# Patient Record
Sex: Female | Born: 1985 | Race: White | Hispanic: No | Marital: Married | Smoking: Never smoker
Health system: Southern US, Community
[De-identification: ages and names within clinical notes are randomized; demographics above are authoritative.]

## PROBLEM LIST (undated history)

## (undated) ENCOUNTER — Inpatient Hospital Stay (HOSPITAL_COMMUNITY): Payer: Self-pay

## (undated) DIAGNOSIS — D582 Other hemoglobinopathies: Secondary | ICD-10-CM

## (undated) DIAGNOSIS — K219 Gastro-esophageal reflux disease without esophagitis: Secondary | ICD-10-CM

## (undated) DIAGNOSIS — Z8619 Personal history of other infectious and parasitic diseases: Secondary | ICD-10-CM

## (undated) DIAGNOSIS — D649 Anemia, unspecified: Secondary | ICD-10-CM

## (undated) DIAGNOSIS — N3010 Interstitial cystitis (chronic) without hematuria: Secondary | ICD-10-CM

## (undated) DIAGNOSIS — Z8719 Personal history of other diseases of the digestive system: Secondary | ICD-10-CM

## (undated) DIAGNOSIS — Z8489 Family history of other specified conditions: Secondary | ICD-10-CM

## (undated) DIAGNOSIS — R011 Cardiac murmur, unspecified: Secondary | ICD-10-CM

## (undated) DIAGNOSIS — J45909 Unspecified asthma, uncomplicated: Secondary | ICD-10-CM

## (undated) DIAGNOSIS — K649 Unspecified hemorrhoids: Secondary | ICD-10-CM

## (undated) DIAGNOSIS — F419 Anxiety disorder, unspecified: Secondary | ICD-10-CM

## (undated) DIAGNOSIS — K589 Irritable bowel syndrome without diarrhea: Secondary | ICD-10-CM

## (undated) DIAGNOSIS — I499 Cardiac arrhythmia, unspecified: Secondary | ICD-10-CM

## (undated) DIAGNOSIS — Z8711 Personal history of peptic ulcer disease: Secondary | ICD-10-CM

## (undated) DIAGNOSIS — R51 Headache: Secondary | ICD-10-CM

## (undated) DIAGNOSIS — R000 Tachycardia, unspecified: Secondary | ICD-10-CM

## (undated) HISTORY — DX: Personal history of other infectious and parasitic diseases: Z86.19

## (undated) HISTORY — DX: Anemia, unspecified: D64.9

## (undated) HISTORY — DX: Gastro-esophageal reflux disease without esophagitis: K21.9

## (undated) HISTORY — DX: Unspecified asthma, uncomplicated: J45.909

## (undated) HISTORY — DX: Other hemoglobinopathies: D58.2

## (undated) HISTORY — DX: Personal history of other diseases of the digestive system: Z87.19

## (undated) HISTORY — DX: Headache: R51

## (undated) HISTORY — DX: Cardiac arrhythmia, unspecified: I49.9

## (undated) HISTORY — DX: Interstitial cystitis (chronic) without hematuria: N30.10

## (undated) HISTORY — DX: Personal history of peptic ulcer disease: Z87.11

## (undated) HISTORY — DX: Irritable bowel syndrome, unspecified: K58.9

## (undated) HISTORY — PX: COLONOSCOPY: SHX174

## (undated) HISTORY — DX: Unspecified hemorrhoids: K64.9

---
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
## 2006-02-27 ENCOUNTER — Ambulatory Visit: Payer: Self-pay | Admitting: Internal Medicine

## 2007-10-18 HISTORY — PX: LAPAROSCOPY: SHX197

## 2008-04-10 ENCOUNTER — Ambulatory Visit: Payer: Self-pay | Admitting: Family Medicine

## 2010-07-01 ENCOUNTER — Ambulatory Visit: Payer: Self-pay | Admitting: Urology

## 2010-07-02 ENCOUNTER — Emergency Department: Payer: Self-pay | Admitting: Emergency Medicine

## 2010-07-07 ENCOUNTER — Ambulatory Visit: Payer: Self-pay | Admitting: Unknown Physician Specialty

## 2010-07-19 ENCOUNTER — Other Ambulatory Visit: Payer: Self-pay

## 2010-10-27 ENCOUNTER — Observation Stay (HOSPITAL_COMMUNITY)
Admission: AD | Admit: 2010-10-27 | Discharge: 2010-10-28 | Payer: Self-pay | Source: Home / Self Care | Attending: Obstetrics & Gynecology | Admitting: Obstetrics & Gynecology

## 2010-11-01 LAB — CBC
HCT: 33.8 % — ABNORMAL LOW (ref 36.0–46.0)
Hemoglobin: 11.8 g/dL — ABNORMAL LOW (ref 12.0–15.0)
MCH: 29.8 pg (ref 26.0–34.0)
MCHC: 34.9 g/dL (ref 30.0–36.0)
MCV: 85.4 fL (ref 78.0–100.0)
Platelets: 196 10*3/uL (ref 150–400)
RBC: 3.96 MIL/uL (ref 3.87–5.11)
RDW: 12.6 % (ref 11.5–15.5)
WBC: 6.3 10*3/uL (ref 4.0–10.5)

## 2010-11-01 LAB — COMPREHENSIVE METABOLIC PANEL
ALT: 40 U/L — ABNORMAL HIGH (ref 0–35)
AST: 28 U/L (ref 0–37)
Albumin: 3.4 g/dL — ABNORMAL LOW (ref 3.5–5.2)
Alkaline Phosphatase: 48 U/L (ref 39–117)
BUN: 9 mg/dL (ref 6–23)
CO2: 23 mEq/L (ref 19–32)
Calcium: 8.9 mg/dL (ref 8.4–10.5)
Chloride: 107 mEq/L (ref 96–112)
Creatinine, Ser: 0.53 mg/dL (ref 0.4–1.2)
GFR calc Af Amer: >60 mL/min (ref >60)
GFR calc non Af Amer: >60 mL/min (ref >60)
Glucose, Bld: 72 mg/dL (ref 70–99)
Potassium: 3.9 mEq/L (ref 3.5–5.1)
Sodium: 136 mEq/L (ref 135–145)
Total Bilirubin: 0.5 mg/dL (ref 0.3–1.2)
Total Protein: 6 g/dL (ref 6.0–8.3)

## 2010-11-01 LAB — URINALYSIS, ROUTINE W REFLEX MICROSCOPIC
Bilirubin Urine: NEGATIVE
Hgb urine dipstick: NEGATIVE
Ketones, ur: 15 mg/dL — AB
Nitrite: NEGATIVE
Protein, ur: NEGATIVE mg/dL
Specific Gravity, Urine: >1.03 — ABNORMAL HIGH (ref 1.005–1.030)
Urine Glucose, Fasting: NEGATIVE mg/dL
Urobilinogen, UA: 0.2 mg/dL (ref 0.0–1.0)
pH: 6 (ref 5.0–8.0)

## 2010-11-01 LAB — DIFFERENTIAL
Basophils Absolute: 0 10*3/uL (ref 0.0–0.1)
Basophils Relative: 0 % (ref 0–1)
Eosinophils Absolute: 0.1 10*3/uL (ref 0.0–0.7)
Eosinophils Relative: 1 % (ref 0–5)
Lymphocytes Relative: 35 % (ref 12–46)
Lymphs Abs: 2.2 10*3/uL (ref 0.7–4.0)
Monocytes Absolute: 0.5 10*3/uL (ref 0.1–1.0)
Monocytes Relative: 8 % (ref 3–12)
Neutro Abs: 3.5 10*3/uL (ref 1.7–7.7)
Neutrophils Relative %: 56 % (ref 43–77)

## 2010-11-01 LAB — LIPASE, BLOOD: Lipase: 18 U/L (ref 11–59)

## 2010-11-01 LAB — AMYLASE: Amylase: 49 U/L (ref 0–105)

## 2011-04-10 ENCOUNTER — Inpatient Hospital Stay (HOSPITAL_COMMUNITY)
Admission: AD | Admit: 2011-04-10 | Discharge: 2011-04-13 | DRG: 373 | Disposition: A | Discharge status: Home / Self Care | Payer: BC Managed Care – PPO | Source: Ambulatory Visit | Attending: Obstetrics and Gynecology | Admitting: Obstetrics and Gynecology

## 2011-04-10 DIAGNOSIS — O9903 Anemia complicating the puerperium: Secondary | ICD-10-CM | POA: Diagnosis not present

## 2011-04-10 DIAGNOSIS — D62 Acute posthemorrhagic anemia: Secondary | ICD-10-CM | POA: Diagnosis not present

## 2011-04-10 DIAGNOSIS — K259 Gastric ulcer, unspecified as acute or chronic, without hemorrhage or perforation: Secondary | ICD-10-CM | POA: Diagnosis present

## 2011-04-10 DIAGNOSIS — O99892 Other specified diseases and conditions complicating childbirth: Secondary | ICD-10-CM | POA: Diagnosis present

## 2011-04-10 DIAGNOSIS — I498 Other specified cardiac arrhythmias: Secondary | ICD-10-CM | POA: Diagnosis present

## 2011-04-10 LAB — CBC
HCT: 32.1 % — ABNORMAL LOW (ref 36.0–46.0)
Hemoglobin: 10.6 g/dL — ABNORMAL LOW (ref 12.0–15.0)
MCH: 27.9 pg (ref 26.0–34.0)
MCHC: 33 g/dL (ref 30.0–36.0)
MCV: 84.5 fL (ref 78.0–100.0)
Platelets: 224 10*3/uL (ref 150–400)
RBC: 3.8 MIL/uL — ABNORMAL LOW (ref 3.87–5.11)
RDW: 14.5 % (ref 11.5–15.5)
WBC: 8.8 10*3/uL (ref 4.0–10.5)

## 2011-04-11 LAB — RPR: RPR Ser Ql: NONREACTIVE

## 2011-04-13 LAB — CBC
HCT: 24.8 % — ABNORMAL LOW (ref 36.0–46.0)
Hemoglobin: 8 g/dL — ABNORMAL LOW (ref 12.0–15.0)
MCH: 27.8 pg (ref 26.0–34.0)
MCHC: 32.3 g/dL (ref 30.0–36.0)
MCV: 86.1 fL (ref 78.0–100.0)
Platelets: 190 10*3/uL (ref 150–400)
RBC: 2.88 MIL/uL — ABNORMAL LOW (ref 3.87–5.11)
RDW: 14.8 % (ref 11.5–15.5)
WBC: 12.6 10*3/uL — ABNORMAL HIGH (ref 4.0–10.5)

## 2011-04-22 NOTE — H&P (Signed)
  NAMEIDA, MILBRATH NO.:  [id number]  MEDICAL RECORD NO.:  [id number]  LOCATION:  9173                          FACILITY:  [facility]  PHYSICIAN:  Lenoard Aden, M.D.DATE OF BIRTH:  [birth year]  DATE OF ADMISSION:  04/10/2011 DATE OF DISCHARGE:                             HISTORY & PHYSICAL   CHIEF COMPLAINT:  History of cardiac arrhythmia, on beta-blocker, at 39 weeks for cervical ripening and induction.  HISTORY OF PRESENT ILLNESS:  She is a 25 year old female G1, P0 at 39 weeks, on chronic metoprolol use for SVT, symptomatic with intermittent chest pain and shortness of breath requiring Cardiology followup, now for controlled attempted induction at 39 weeks.  MEDICATIONS:  Include prenatal vitamins, Vicodin as needed for migraines, and atenolol.  She has allergies occasionally to Lasix.  She is a nonsmoker, nondrinker.  She denies domestic or physical violence.  MEDICAL HISTORY:  Peptic ulcer disease, headaches, interstitial cystitis and  SVT.  SURGICAL HISTORY:  Otherwise, noncontributory.  PRENATAL COURSE:  Otherwise, uncomplicated.  FAMILY HISTORY:  Kidney disease, thyroid disease, heart disease.  PHYSICAL EXAMINATION:  GENERAL:  She is a well-developed, well-nourished white female, in no acute distress. HEENT:  Normal. NECK:  Supple.  Full range of motion. LUNGS:  Clear. HEART:  Regular rhythm. ABDOMEN:  Soft, gravid, and nontender. PELVIC: Cervix is 2-3 cm, 60%, Vertex, -1. EXTREMITIES:  There are no cords. NEUROLOGIC:  Nonfocal. SKIN:  Intact.  IMPRESSION: 1. A 39-week intrauterine pregnancy. 2. History of supraventricular tachycardia, intermittent and     symptomatic. 3. Gastric ulcer disease.  PLAN:  Proceed with cervical ripening and induction, anticipate cautious attempts at vaginal delivery.     Lenoard Aden, M.D.     RJT/MEDQ  D:  04/11/2011  T:  04/11/2011  Job:  [id number]  Electronically Signed by Olivia Mackie M.D. on 04/22/2011 07:36:28 AM

## 2012-08-08 LAB — OB RESULTS CONSOLE ABO/RH: RH Type: POSITIVE

## 2012-08-08 LAB — OB RESULTS CONSOLE GC/CHLAMYDIA
Chlamydia: NEGATIVE
Gonorrhea: NEGATIVE

## 2012-08-08 LAB — OB RESULTS CONSOLE HEPATITIS B SURFACE ANTIGEN: Hepatitis B Surface Ag: NEGATIVE

## 2012-08-08 LAB — OB RESULTS CONSOLE RUBELLA ANTIBODY, IGM: Rubella: IMMUNE

## 2012-08-08 LAB — OB RESULTS CONSOLE RPR: RPR: NONREACTIVE

## 2012-08-08 LAB — OB RESULTS CONSOLE HIV ANTIBODY (ROUTINE TESTING): HIV: NONREACTIVE

## 2012-10-17 NOTE — L&D Delivery Note (Signed)
Delivery Note At 11:00 AM a viable and healthy female was delivered via Vaginal, Spontaneous Delivery (Presentation:ROA ).  APGAR: 9, 9; weight pending.   Placenta status: Intact, Spontaneous.  Cord: 3 vessels with the following complications: None.  Cord pH: na  Anesthesia: Epidural  Episiotomy: None Lacerations: second degree Suture Repair: 2.0 vicryl rapide Est. Blood Loss (mL): 200  Mom to postpartum.  Baby to nursery-stable.  Samora Jernberg J 03/04/2013, 11:27 AM

## 2012-11-05 ENCOUNTER — Inpatient Hospital Stay (HOSPITAL_COMMUNITY)
Admission: AD | Admit: 2012-11-05 | Discharge: 2012-11-05 | Disposition: A | Discharge status: Home / Self Care | Payer: BC Managed Care – PPO | Source: Ambulatory Visit | Attending: Obstetrics & Gynecology | Admitting: Obstetrics & Gynecology

## 2012-11-05 ENCOUNTER — Encounter (HOSPITAL_COMMUNITY): Payer: Self-pay | Admitting: *Deleted

## 2012-11-05 DIAGNOSIS — O210 Mild hyperemesis gravidarum: Secondary | ICD-10-CM | POA: Insufficient documentation

## 2012-11-05 DIAGNOSIS — R109 Unspecified abdominal pain: Secondary | ICD-10-CM | POA: Insufficient documentation

## 2012-11-05 LAB — COMPREHENSIVE METABOLIC PANEL
ALT: 15 U/L (ref 0–35)
AST: 23 U/L (ref 0–37)
Albumin: 3 g/dL — ABNORMAL LOW (ref 3.5–5.2)
Alkaline Phosphatase: 60 U/L (ref 39–117)
BUN: 10 mg/dL (ref 6–23)
CO2: 26 mEq/L (ref 19–32)
Calcium: 8.4 mg/dL (ref 8.4–10.5)
Chloride: 100 mEq/L (ref 96–112)
Creatinine, Ser: 0.68 mg/dL (ref 0.50–1.10)
GFR calc Af Amer: >90 mL/min (ref >90)
GFR calc non Af Amer: >90 mL/min (ref >90)
Glucose, Bld: 86 mg/dL (ref 70–99)
Potassium: 4.2 mEq/L (ref 3.5–5.1)
Sodium: 135 mEq/L (ref 135–145)
Total Bilirubin: 0.4 mg/dL (ref 0.3–1.2)
Total Protein: 6.5 g/dL (ref 6.0–8.3)

## 2012-11-05 MED ORDER — ACETAMINOPHEN 325 MG PO TABS
650.0000 mg | ORAL_TABLET | Freq: Once | ORAL | Status: AC
Start: 2012-11-05 — End: 2012-11-05
  Administered 2012-11-05: 650 mg via ORAL
  Filled 2012-11-05: qty 2

## 2012-11-05 MED ORDER — PROMETHAZINE HCL 25 MG/ML IJ SOLN
12.5000 mg | Freq: Once | INTRAVENOUS | Status: AC
  Administered 2012-11-05: 12.5 mg via INTRAVENOUS
  Filled 2012-11-05: qty 0.5

## 2012-11-05 NOTE — Progress Notes (Signed)
Patient ID: Haley Wong, female   DOB: December 29, 1985, 27 y.o.   MRN: [id number] 36 hours of nausea and vomiting with ketonuria. VSS - afebrile PE dictated. Note dictated.

## 2012-11-05 NOTE — MAU Note (Signed)
Vomiting for over 24hours.  Rt side pain, worse when throws up.  Went and saw Dr Billy Coast, sent in for fluids.

## 2012-11-05 NOTE — H&P (Signed)
NAMESENIYA, STOFFERS NO.:  [id number]  MEDICAL RECORD NO.:  [id number]  LOCATION:  9157                          [facility]ILITY:  [facility]  PHYSICIAN:  Lenoard Aden, M.D.DATE OF BIRTH:  Dec 03, 1985  DATE OF ADMISSION:  11/05/2012 DATE OF DISCHARGE:                             HISTORY & PHYSICAL   CHIEF COMPLAINT:  Nausea and vomiting times 24-36 hours and lower abdominal cramping.  HISTORY OF PRESENT ILLNESS:  She is a 27 year old white female, G2, P1, at 38 and 2/7th weeks gestation with uncomplicated pregnancy to date, who presents with acute onset of nausea and vomiting over the last 24-36 hours, unable to keep p.o. fluids down.  She was seen in the office today for intravenous fluids. Normal CBC and normal CMP.  The patient has been afebrile.  PAST MEDICAL HISTORY:  Remarkable.  ALLERGIES:  She has allergic to latex and pineapple.  MEDICATIONS:  Zofran, Nexium, ProAir HFA inhaler p.r.n., prenatal vitamins, and metoprolol for palpitations.  SOCIAL HISTORY:  Noncontributory.  FAMILY HISTORY:  Family history of heart disease, kidney disease, diabetes, and chronic hypertension.  She has a previous history of vaginal delivery at term.  PAST SURGICAL HISTORY:  Remarkable for laparoscopy and upper GI with endoscopy.  PHYSICAL EXAMINATION:  GENERAL:  She is a well-developed, well- nourished, white female, in no acute distress.  HEENT:  Normal. NECK:  Supple.  Full range of motion. LUNGS:  Clear. HEART:  Regular rate and rhythm. ABDOMEN:  Soft, gravid, nontender.  No rebound, no guarding.  Normal bowel sounds noted.  Fetal heart tones appreciated.  Fundal height is appropriate.  No CVA tenderness. EXTREMITIES:  There are no cords. NEUROLOGIC:  Nonfocal. SKIN:  Intact. PELVIC:  Deferred.  LABORATORY DATA:  CMP and CBC is within normal limits.  White count of 5.4, otherwise normal.  Profile was noted.  Fetal heart tones reassuring.  IMPRESSION:  A 22 week  intrauterine pregnancy with nausea, vomiting, and probable viral gastroenteritis.  PLAN:  IV fluids, antiemetics, possible GI cocktail.  Anticipate discharge home after IV fluids.     Lenoard Aden, M.D.     RJT/MEDQ  D:  11/05/2012  T:  11/05/2012  Job:  [id number]

## 2013-02-07 LAB — OB RESULTS CONSOLE GBS: GBS: NEGATIVE

## 2013-02-22 ENCOUNTER — Other Ambulatory Visit: Payer: Self-pay | Admitting: Obstetrics and Gynecology

## 2013-02-25 ENCOUNTER — Encounter (HOSPITAL_COMMUNITY): Payer: Self-pay | Admitting: *Deleted

## 2013-02-25 ENCOUNTER — Other Ambulatory Visit: Payer: Self-pay | Admitting: Obstetrics and Gynecology

## 2013-02-25 ENCOUNTER — Telehealth (HOSPITAL_COMMUNITY): Payer: Self-pay | Admitting: *Deleted

## 2013-02-25 NOTE — Telephone Encounter (Signed)
Preadmission screen  

## 2013-03-03 ENCOUNTER — Inpatient Hospital Stay (HOSPITAL_COMMUNITY)
Admission: RE | Admit: 2013-03-03 | Discharge: 2013-03-06 | DRG: 372 | Disposition: A | Discharge status: Home / Self Care | Payer: BC Managed Care – PPO | Source: Ambulatory Visit | Attending: Obstetrics and Gynecology | Admitting: Obstetrics and Gynecology

## 2013-03-03 ENCOUNTER — Inpatient Hospital Stay (HOSPITAL_COMMUNITY): Admission: AD | Admit: 2013-03-03 | Payer: Self-pay | Source: Ambulatory Visit | Admitting: Obstetrics and Gynecology

## 2013-03-03 ENCOUNTER — Encounter (HOSPITAL_COMMUNITY): Payer: Self-pay

## 2013-03-03 DIAGNOSIS — Z91040 Latex allergy status: Secondary | ICD-10-CM

## 2013-03-03 DIAGNOSIS — I2510 Atherosclerotic heart disease of native coronary artery without angina pectoris: Secondary | ICD-10-CM | POA: Diagnosis present

## 2013-03-03 DIAGNOSIS — O4100X0 Oligohydramnios, unspecified trimester, not applicable or unspecified: Principal | ICD-10-CM | POA: Diagnosis present

## 2013-03-03 DIAGNOSIS — I498 Other specified cardiac arrhythmias: Secondary | ICD-10-CM | POA: Diagnosis present

## 2013-03-03 HISTORY — DX: Anxiety disorder, unspecified: F41.9

## 2013-03-03 HISTORY — DX: Tachycardia, unspecified: R00.0

## 2013-03-03 LAB — CBC
Platelets: 222 10*3/uL (ref 150–400)
RDW: 14.3 % (ref 11.5–15.5)
WBC: 8.2 10*3/uL (ref 4.0–10.5)

## 2013-03-03 MED ORDER — OXYCODONE-ACETAMINOPHEN 5-325 MG PO TABS: 1.0000 | ORAL_TABLET | ORAL | Status: DC | PRN

## 2013-03-03 MED ORDER — LIDOCAINE HCL (PF) 1 % IJ SOLN
30.0000 mL | INTRAMUSCULAR | Status: DC | PRN
  Filled 2013-03-03 (×2): qty 30

## 2013-03-03 MED ORDER — ACETAMINOPHEN 325 MG PO TABS: 650.0000 mg | ORAL_TABLET | ORAL | Status: DC | PRN

## 2013-03-03 MED ORDER — LACTATED RINGERS IV SOLN
INTRAVENOUS | Status: DC
  Administered 2013-03-03 – 2013-03-04 (×3): via INTRAVENOUS

## 2013-03-03 MED ORDER — LACTATED RINGERS IV SOLN: 500.0000 mL | INTRAVENOUS | Status: DC | PRN

## 2013-03-03 MED ORDER — OXYTOCIN BOLUS FROM INFUSION: 500.0000 mL | INTRAVENOUS | Status: DC

## 2013-03-03 MED ORDER — CITRIC ACID-SODIUM CITRATE 334-500 MG/5ML PO SOLN: 30.0000 mL | ORAL | Status: DC | PRN

## 2013-03-03 MED ORDER — ONDANSETRON HCL 4 MG/2ML IJ SOLN
4.0000 mg | Freq: Four times a day (QID) | INTRAMUSCULAR | Status: DC | PRN
  Administered 2013-03-04: 4 mg via INTRAVENOUS
  Filled 2013-03-03: qty 2

## 2013-03-03 MED ORDER — METOPROLOL SUCCINATE ER 25 MG PO TB24
25.0000 mg | ORAL_TABLET | Freq: Every day | ORAL | Status: DC
  Administered 2013-03-03: 25 mg via ORAL
  Filled 2013-03-03 (×2): qty 1

## 2013-03-03 MED ORDER — HOME MED STORE IN PYXIS: 2.0000 | Freq: Four times a day (QID) | Status: DC | PRN

## 2013-03-03 MED ORDER — FLEET ENEMA 7-19 GM/118ML RE ENEM: 1.0000 | ENEMA | RECTAL | Status: DC | PRN

## 2013-03-03 MED ORDER — TERBUTALINE SULFATE 1 MG/ML IJ SOLN: 0.2500 mg | Freq: Once | INTRAMUSCULAR | Status: AC | PRN

## 2013-03-03 MED ORDER — ZOLPIDEM TARTRATE 5 MG PO TABS
5.0000 mg | ORAL_TABLET | Freq: Every evening | ORAL | Status: DC | PRN
  Administered 2013-03-03: 5 mg via ORAL
  Filled 2013-03-03: qty 1

## 2013-03-03 MED ORDER — MISOPROSTOL 25 MCG QUARTER TABLET
25.0000 ug | ORAL_TABLET | ORAL | Status: DC | PRN
  Administered 2013-03-03 – 2013-03-04 (×2): 25 ug via VAGINAL
  Filled 2013-03-03: qty 1
  Filled 2013-03-03 (×2): qty 0.25

## 2013-03-03 MED ORDER — IBUPROFEN 600 MG PO TABS: 600.0000 mg | ORAL_TABLET | Freq: Four times a day (QID) | ORAL | Status: DC | PRN

## 2013-03-03 MED ORDER — OXYTOCIN 40 UNITS IN LACTATED RINGERS INFUSION - SIMPLE MED
1.0000 m[IU]/min | INTRAVENOUS | Status: DC
  Administered 2013-03-04: 2 m[IU]/min via INTRAVENOUS
  Filled 2013-03-03: qty 1000

## 2013-03-03 MED ORDER — ALBUTEROL SULFATE HFA 108 (90 BASE) MCG/ACT IN AERS
2.0000 | INHALATION_SPRAY | Freq: Four times a day (QID) | RESPIRATORY_TRACT | Status: DC | PRN
  Administered 2013-03-03: 2 via RESPIRATORY_TRACT

## 2013-03-03 MED ORDER — OXYTOCIN 40 UNITS IN LACTATED RINGERS INFUSION - SIMPLE MED: 62.5000 mL/h | INTRAVENOUS | Status: DC

## 2013-03-03 NOTE — H&P (Signed)
NAMEKRYSTYNE, Haley Wong             ACCOUNT NO.:  [id number]  MEDICAL RECORD NO.:  [id number]  LOCATION:  9163                          [facility]ILITY:  [facility]  PHYSICIAN:  Lenoard Aden, M.D.DATE OF BIRTH:  Oct 16, 1986  DATE OF ADMISSION:  03/03/2013 DATE OF DISCHARGE:                             HISTORY & PHYSICAL   CHIEF COMPLAINT:  History of beta-blocker use and mild oligohydramnios at 39 weeks for induction.  HISTORY OF PRESENT ILLNESS:  She is a 27 year old white female, G2, P1, at 39-2/7th weeks' gestation who presents with aforementioned indications for induction.  SHE HAS ALLERGIES TO LATEX AND PINEAPPLE.  MEDICATIONS:  Fioricet, pantoprazole for reflux, hydrocodone for low back pain, Zofran as needed, ProAir HFA inhaler as needed, metoprolol, and prenatal vitamins.  She is a nonsmoker, nondrinker.  She denies domestic or physical violence.  Her medical problems include cardiac arrhythmia; sinus tachycardia, managed by Dr.  __________, cardiologist; and gastroesophageal reflux previously with a history of gastric ulcer.  FAMILY HISTORY:  Diabetes, kidney disease, heart disease, thyroid disease, hypertension.  She has had a previous obstetric history remarkable for a 7-pound female born in 2012.  She has also a surgical history of laparoscopy and endoscopy.  Prenatal course complicated as noted.  PHYSICAL EXAMINATION:  GENERAL:  She is a well-developed, well- nourished, white female, in no acute distress. HEENT:  Normal. NECK:  Supple.  Full range of motion. LUNGS:  Clear. HEART:  Regular rhythm. ABDOMEN:  Soft, gravid, nontender.  No CVA tenderness. EXTREMITIES:  There are no cords. NEUROLOGIC:  Nonfocal. SKIN:  Intact. CERVIX:  Closed, 77%, vertex, -1.  NST is reactive.  Cytotec was placed.  IMPRESSION: 1. A 39-week intrauterine pregnancy. 2. History of mild oligohydramnios with amniotic fluid index, AFI of     7. 3. History of beta-blocker use for cardiac  dysrhythmia.  PLAN:  Proceed with Cytotec induction, epidural as needed.  Pitocin in a.m.  Anticipate attempts at vaginal delivery.     Lenoard Aden, M.D.     RJT/MEDQ  D:  03/03/2013  T:  03/03/2013  Job:  [id number]

## 2013-03-03 NOTE — Progress Notes (Signed)
Mairen Wallenstein is a 27 y.o. G2P1001 at [redacted]w[redacted]d by LMP admitted for induction of labor due to Low amniotic fluid and history of BBlocker use for induction.  Subjective: Good FM , no contractions.  Objective: BP 123/75  Pulse 66  Temp(Src) 98 F (36.7 C) (Oral)  Resp 18  Ht 5\' 5"  (1.651 m)  Wt 89.359 kg (197 lb)  BMI 32.78 kg/m2      FHT:  FHR: 120 bpm, variability: moderate,  accelerations:  Present,  decelerations:  Absent UC:   none SVE:    cl/60/-2  Labs: CBC pending  Assessment / Plan: 39 weeks BBlocker use Mild Oligohydramnios  Labor: Progressing normally -cytotec pending placement Preeclampsia:  na Fetal Wellbeing:  Category I Pain Control:  Labor support without medications I/D:  n/a Anticipated MOD:  NSVD  Torryn Fiske J 03/03/2013, 8:19 PM

## 2013-03-04 ENCOUNTER — Encounter (HOSPITAL_COMMUNITY): Payer: Self-pay | Admitting: Anesthesiology

## 2013-03-04 ENCOUNTER — Encounter (HOSPITAL_COMMUNITY): Payer: Self-pay

## 2013-03-04 ENCOUNTER — Inpatient Hospital Stay (HOSPITAL_COMMUNITY): Payer: BC Managed Care – PPO | Admitting: Anesthesiology

## 2013-03-04 LAB — RPR: RPR Ser Ql: NONREACTIVE

## 2013-03-04 MED ORDER — PHENYLEPHRINE 40 MCG/ML (10ML) SYRINGE FOR IV PUSH (FOR BLOOD PRESSURE SUPPORT)
80.0000 ug | PREFILLED_SYRINGE | INTRAVENOUS | Status: DC | PRN
  Filled 2013-03-04: qty 5
  Filled 2013-03-04: qty 2

## 2013-03-04 MED ORDER — PANTOPRAZOLE SODIUM 20 MG PO TBEC
20.0000 mg | DELAYED_RELEASE_TABLET | Freq: Every day | ORAL | Status: DC
  Administered 2013-03-04 – 2013-03-06 (×3): 20 mg via ORAL
  Filled 2013-03-04 (×3): qty 1

## 2013-03-04 MED ORDER — METHYLERGONOVINE MALEATE 0.2 MG/ML IJ SOLN: 0.2000 mg | INTRAMUSCULAR | Status: DC | PRN

## 2013-03-04 MED ORDER — DIPHENHYDRAMINE HCL 25 MG PO CAPS: 25.0000 mg | ORAL_CAPSULE | Freq: Four times a day (QID) | ORAL | Status: DC | PRN

## 2013-03-04 MED ORDER — BENZOCAINE-MENTHOL 20-0.5 % EX AERO
1.0000 "application " | INHALATION_SPRAY | CUTANEOUS | Status: DC | PRN
  Administered 2013-03-04 – 2013-03-05 (×2): 1 via TOPICAL
  Filled 2013-03-04 (×2): qty 56

## 2013-03-04 MED ORDER — DIBUCAINE 1 % RE OINT: 1.0000 "application " | TOPICAL_OINTMENT | RECTAL | Status: DC | PRN

## 2013-03-04 MED ORDER — OXYCODONE-ACETAMINOPHEN 5-325 MG PO TABS
1.0000 | ORAL_TABLET | ORAL | Status: DC | PRN
  Administered 2013-03-04 – 2013-03-06 (×10): 1 via ORAL
  Filled 2013-03-04 (×5): qty 1
  Filled 2013-03-04: qty 2
  Filled 2013-03-04 (×4): qty 1

## 2013-03-04 MED ORDER — ZOLPIDEM TARTRATE 5 MG PO TABS: 5.0000 mg | ORAL_TABLET | Freq: Every evening | ORAL | Status: DC | PRN

## 2013-03-04 MED ORDER — LACTATED RINGERS IV SOLN: 500.0000 mL | Freq: Once | INTRAVENOUS | Status: DC

## 2013-03-04 MED ORDER — IBUPROFEN 600 MG PO TABS
600.0000 mg | ORAL_TABLET | Freq: Four times a day (QID) | ORAL | Status: DC
  Administered 2013-03-04 – 2013-03-06 (×8): 600 mg via ORAL
  Filled 2013-03-04 (×8): qty 1

## 2013-03-04 MED ORDER — EPHEDRINE 5 MG/ML INJ
10.0000 mg | INTRAVENOUS | Status: DC | PRN
Start: 2013-03-04 — End: 2013-03-04
  Filled 2013-03-04: qty 2
  Filled 2013-03-04: qty 4

## 2013-03-04 MED ORDER — SIMETHICONE 80 MG PO CHEW: 80.0000 mg | CHEWABLE_TABLET | ORAL | Status: DC | PRN

## 2013-03-04 MED ORDER — PHENYLEPHRINE 40 MCG/ML (10ML) SYRINGE FOR IV PUSH (FOR BLOOD PRESSURE SUPPORT)
80.0000 ug | PREFILLED_SYRINGE | INTRAVENOUS | Status: DC | PRN
  Filled 2013-03-04: qty 2

## 2013-03-04 MED ORDER — DIPHENHYDRAMINE HCL 50 MG/ML IJ SOLN: 12.5000 mg | INTRAMUSCULAR | Status: DC | PRN

## 2013-03-04 MED ORDER — METOPROLOL SUCCINATE ER 25 MG PO TB24
25.0000 mg | ORAL_TABLET | Freq: Every day | ORAL | Status: DC
  Administered 2013-03-04: 25 mg via ORAL
  Filled 2013-03-04 (×2): qty 1

## 2013-03-04 MED ORDER — ALBUTEROL SULFATE HFA 108 (90 BASE) MCG/ACT IN AERS
2.0000 | INHALATION_SPRAY | Freq: Four times a day (QID) | RESPIRATORY_TRACT | Status: DC | PRN
  Filled 2013-03-04: qty 6.7

## 2013-03-04 MED ORDER — EPHEDRINE 5 MG/ML INJ
10.0000 mg | INTRAVENOUS | Status: DC | PRN
  Filled 2013-03-04: qty 2

## 2013-03-04 MED ORDER — SENNOSIDES-DOCUSATE SODIUM 8.6-50 MG PO TABS
2.0000 | ORAL_TABLET | Freq: Every day | ORAL | Status: DC
  Administered 2013-03-04 – 2013-03-05 (×2): 2 via ORAL

## 2013-03-04 MED ORDER — PRENATAL MULTIVITAMIN CH
1.0000 | ORAL_TABLET | Freq: Every day | ORAL | Status: DC
  Administered 2013-03-04 – 2013-03-05 (×2): 1 via ORAL
  Filled 2013-03-04 (×2): qty 1

## 2013-03-04 MED ORDER — BUTORPHANOL TARTRATE 1 MG/ML IJ SOLN
1.0000 mg | INTRAMUSCULAR | Status: DC | PRN
  Administered 2013-03-04: 1 mg via INTRAVENOUS
  Filled 2013-03-04: qty 1

## 2013-03-04 MED ORDER — LIDOCAINE HCL (PF) 1 % IJ SOLN
INTRAMUSCULAR | Status: DC | PRN
  Administered 2013-03-04 (×2): 4 mL

## 2013-03-04 MED ORDER — METHYLERGONOVINE MALEATE 0.2 MG PO TABS: 0.2000 mg | ORAL_TABLET | ORAL | Status: DC | PRN

## 2013-03-04 MED ORDER — WITCH HAZEL-GLYCERIN EX PADS: 1.0000 "application " | MEDICATED_PAD | CUTANEOUS | Status: DC | PRN

## 2013-03-04 MED ORDER — FENTANYL 2.5 MCG/ML BUPIVACAINE 1/10 % EPIDURAL INFUSION (WH - ANES)
14.0000 mL/h | INTRAMUSCULAR | Status: DC | PRN
  Filled 2013-03-04: qty 125

## 2013-03-04 MED ORDER — FENTANYL 2.5 MCG/ML BUPIVACAINE 1/10 % EPIDURAL INFUSION (WH - ANES)
INTRAMUSCULAR | Status: DC | PRN
  Administered 2013-03-04: 14 mL/h via EPIDURAL

## 2013-03-04 MED ORDER — LANOLIN HYDROUS EX OINT: TOPICAL_OINTMENT | CUTANEOUS | Status: DC | PRN

## 2013-03-04 MED ORDER — ONDANSETRON HCL 4 MG/2ML IJ SOLN: 4.0000 mg | INTRAMUSCULAR | Status: DC | PRN

## 2013-03-04 MED ORDER — TETANUS-DIPHTH-ACELL PERTUSSIS 5-2.5-18.5 LF-MCG/0.5 IM SUSP
0.5000 mL | Freq: Once | INTRAMUSCULAR | Status: DC
Start: 2013-03-05 — End: 2013-03-05

## 2013-03-04 MED ORDER — ONDANSETRON HCL 4 MG PO TABS: 4.0000 mg | ORAL_TABLET | ORAL | Status: DC | PRN

## 2013-03-04 NOTE — Anesthesia Procedure Notes (Signed)
Epidural Patient location during procedure: OB Start time: 03/04/2013 8:56 AM  Staffing Anesthesiologist: Marico Buckle A. Performed by: anesthesiologist   Preanesthetic Checklist Completed: patient identified, site marked, surgical consent, pre-op evaluation, timeout performed, IV checked, risks and benefits discussed and monitors and equipment checked  Epidural Patient position: sitting Prep: site prepped and draped and DuraPrep Patient monitoring: continuous pulse ox and blood pressure Approach: midline Injection technique: LOR air  Needle:  Needle type: Tuohy  Needle gauge: 17 G Needle length: 9 cm and 9 Needle insertion depth: 5 cm cm Catheter type: closed end flexible Catheter size: 19 Gauge Catheter at skin depth: 10 cm Test dose: negative and Other  Assessment Events: blood not aspirated, injection not painful, no injection resistance, negative IV test and no paresthesia  Additional Notes Patient identified. Risks and benefits discussed including failed block, incomplete  Pain control, post dural puncture headache, nerve damage, paralysis, blood pressure Changes, nausea, vomiting, reactions to medications-both toxic and allergic and post Partum back pain. All questions were answered. Patient expressed understanding and wished to proceed. Sterile technique was used throughout procedure. Epidural site was Dressed with sterile barrier dressing. No paresthesias, signs of intravascular injection Or signs of intrathecal spread were encountered.  Patient was more comfortable after the epidural was dosed. Please see RN's note for documentation of vital signs and FHR which are stable.

## 2013-03-04 NOTE — Anesthesia Postprocedure Evaluation (Signed)
Anesthesia Post Note  Patient: Haley Wong  Procedure(s) Performed: * No procedures listed *  Anesthesia type: Epidural  Patient location: Mother/Baby  Post pain: Pain level controlled  Post assessment: Post-op Vital signs reviewed  Last Vitals:  Filed Vitals:   03/04/13 1315  BP: 113/63  Pulse: 69  Temp:   Resp: 18    Post vital signs: Reviewed  Level of consciousness:alert  Complications: No apparent anesthesia complications

## 2013-03-04 NOTE — Anesthesia Preprocedure Evaluation (Signed)
Anesthesia Evaluation  Patient identified by MRN, date of birth, ID band Patient awake    Reviewed: Allergy & Precautions, H&P , Patient's Chart, lab work & pertinent test results  Airway Mallampati: III TM Distance: >3 FB Neck ROM: full    Dental no notable dental hx. (+) Teeth Intact   Pulmonary neg pulmonary ROS, asthma ,  breath sounds clear to auscultation  Pulmonary exam normal       Cardiovascular + dysrhythmias Supra Ventricular Tachycardia Rhythm:regular Rate:Normal     Neuro/Psych  Headaches, PSYCHIATRIC DISORDERS Anxiety negative neurological ROS     GI/Hepatic negative GI ROS, Neg liver ROS, GERD-  Medicated and Controlled,IBS   Endo/Other  negative endocrine ROSObesity  Renal/GU negative Renal ROS   Interstitial Cystitis    Musculoskeletal negative musculoskeletal ROS (+)   Abdominal Normal abdominal exam  (+)   Peds  Hematology  (+) anemia ,   Anesthesia Other Findings   Reproductive/Obstetrics (+) Pregnancy                           Anesthesia Physical Anesthesia Plan  ASA: II  Anesthesia Plan: Epidural   Post-op Pain Management:    Induction:   Airway Management Planned:   Additional Equipment:   Intra-op Plan:   Post-operative Plan:   Informed Consent: I have reviewed the patients History and Physical, chart, labs and discussed the procedure including the risks, benefits and alternatives for the proposed anesthesia with the patient or authorized representative who has indicated his/her understanding and acceptance.     Plan Discussed with: Anesthesiologist  Anesthesia Plan Comments:         Anesthesia Quick Evaluation

## 2013-03-05 LAB — CBC
HCT: 30.8 % — ABNORMAL LOW (ref 36.0–46.0)
Hemoglobin: 10 g/dL — ABNORMAL LOW (ref 12.0–15.0)
MCHC: 32.5 g/dL (ref 30.0–36.0)
WBC: 8.7 10*3/uL (ref 4.0–10.5)

## 2013-03-05 MED ORDER — IBUPROFEN 600 MG PO TABS: 600.0000 mg | ORAL_TABLET | Freq: Four times a day (QID) | ORAL | Status: DC | PRN

## 2013-03-05 MED ORDER — DOCUSATE SODIUM 100 MG PO CAPS: 100.0000 mg | ORAL_CAPSULE | Freq: Two times a day (BID) | ORAL | Status: DC | PRN

## 2013-03-05 MED ORDER — OXYCODONE-ACETAMINOPHEN 5-325 MG PO TABS: 1.0000 | ORAL_TABLET | Freq: Four times a day (QID) | ORAL | Status: DC | PRN

## 2013-03-05 NOTE — Discharge Summary (Signed)
Obstetric Discharge Summary Reason for Admission: G 2 P1 0 0 1 @ 39w 2d for IOL; Mild oligohydramnios with AFI 7.  Hx cardiac dysrhythmia, on beta blocker Prenatal Procedures: NST and ultrasound Intrapartum Procedures: spontaneous vaginal delivery Postpartum Procedures: none Complications-Operative and Postpartum: 1st degree perineal laceration Hemoglobin  Date Value Range Status  03/05/2013 10.0* 12.0 - 15.0 g/dL Final     HCT  Date Value Range Status  03/05/2013 30.8* 36.0 - 46.0 % Final    Physical Exam:  General: alert, cooperative and no distress Lochia: appropriate Uterine Fundus: firm Incision: na DVT Evaluation: No evidence of DVT seen on physical exam. Negative Homan's sign.  Discharge Diagnoses: G2 P2 @ 39wks, S/P SVD with 1st degree laceration  Discharge Information: Date: 03/05/2013 Activity: pelvic rest Diet: routine Medications: PNV, Ibuprofen, Colace and Percocet Condition: stable Instructions: refer to practice specific booklet Discharge to: home Follow-up Information   Follow up with Lenoard Aden, MD In 6 weeks.   Contact information:   [address] [place] [address] [phone]       Newborn Data: Live born female on 03/04/13 Birth Weight: 6 lb 12.3 oz (3070 g) APGAR: 8, 9  Home with mother.  Talon Witting K 03/05/2013, 9:36 AM

## 2013-03-05 NOTE — Progress Notes (Signed)
Patient ID: Haley Wong, female   DOB: April 21, 1986, 27 y.o.   MRN: [id number] PPD # 1  Subjective: Pt reports feeling well and eager for d/c home/ Pain controlled with ibuprofen and percocet Tolerating po/ Voiding without problems/ No n/v Bleeding is light Newborn info:  Information for the patient's newborn:  Shawhan, Girl [place] [[id number]]  female Feeding: breast; doing ok, but additional support with latch requested.   Objective:  VS: Blood pressure 106/60, pulse 59, temperature 97.6 F (36.4 C), temperature source Oral, resp. rate 18.    Recent Labs  03/03/13 2115 03/05/13 0620  WBC 8.2 8.7  HGB 11.3* 10.0*  HCT 33.1* 30.8*  PLT 222 180    Blood type: A/Positive/-- (10/23 0000) Rubella: Immune (10/23 0000)    Physical Exam:  General: A & O x 3  alert, cooperative and no distress CV: Regular rate and rhythm Resp: clear Abdomen: soft, nontender, normal bowel sounds Uterine Fundus: firm, below umbilicus, nontender Perineum: healing with good reapproximation and mild edema Lochia: minimal Ext: edema trace to +1 and Homans sign is negative, no sign of DVT   A/P: PPD # 1/ G2P2002/ S/P: SVD with 1st deg lac Additional lactation support before d/c home Doing well and stable for d/c home today RX: Ibuprofen 600mg  po Q 6 hrs prn pain #30 Refill x 1 Percocet 5/325 po Q 6 hrs prn pain #15 Rt pp visit in 6 weeks    Demetrius Revel, MSN, [facility] 03/05/2013, 9:32 AM

## 2013-03-06 NOTE — Progress Notes (Signed)
Patient ID: Haley Wong, female   DOB: [birth year], 27 y.o.   MRN: [id number] PPD # 2  Subjective: Pt reports feeling well and eager for d/c home/ Pain controlled with ibuprofen and percocet Tolerating po/ Voiding without problems/ No n/v Bleeding is light/ Newborn info:  Information for the patient's newborn:  Wong, Girl Haley [[id number]]  female Feeding: breast    Objective:  VS: Blood pressure 105/63, pulse 71, temperature 97.9 F (36.6 C), temperature source Oral, resp. rate 18, height 5\' 5"  (1.651 m), weight 89.359 kg (197 lb), SpO2 99.00%, unknown if currently breastfeeding.    Recent Labs  03/03/13 2115 03/05/13 0620  WBC 8.2 8.7  HGB 11.3* 10.0*  HCT 33.1* 30.8*  PLT 222 180    Blood type: A/Positive/-- (10/23 0000) Rubella: Immune (10/23 0000)    Physical Exam:  General: A & O x 3  alert, cooperative and no distress CV: Regular rate and rhythm Resp: clear Abdomen: soft, nontender, normal bowel sounds Uterine Fundus: firm, below umbilicus, nontender Perineum: healing with good reapproximation Lochia: minimal Ext: edema trace and Homans sign is negative, no sign of DVT    A/P: PPD # 2/ G2P2002/ S/P:  SVD with 1st deg lac Doing well and stable for discharge home RX: Ibuprofen 600mg  po Q 6 hrs prn pain #30 Refill x 1 Percocet 5/325 1 to 2 po Q 4 hrs prn pain #15 No refill WOB/GYN booklet given Routine pp visit in 6wks   Demetrius Revel, MSN, [facility] 03/06/2013, 9:11 AM

## 2014-04-29 ENCOUNTER — Inpatient Hospital Stay: Payer: Self-pay | Admitting: Internal Medicine

## 2014-04-29 LAB — COMPREHENSIVE METABOLIC PANEL
ALBUMIN: 3.5 g/dL (ref 3.4–5.0)
ALK PHOS: 69 U/L
AST: 16 U/L (ref 15–37)
Anion Gap: 6 — ABNORMAL LOW (ref 7–16)
BUN: 8 mg/dL (ref 7–18)
Bilirubin,Total: 0.5 mg/dL (ref 0.2–1.0)
CALCIUM: 8.6 mg/dL (ref 8.5–10.1)
CHLORIDE: 110 mmol/L — AB (ref 98–107)
CO2: 24 mmol/L (ref 21–32)
CREATININE: 0.84 mg/dL (ref 0.60–1.30)
EGFR (African American): >60
EGFR (Non-African Amer.): >60
GLUCOSE: 85 mg/dL (ref 65–99)
OSMOLALITY: 277 (ref 275–301)
Potassium: 3.5 mmol/L (ref 3.5–5.1)
SGPT (ALT): 23 U/L (ref 12–78)
SODIUM: 140 mmol/L (ref 136–145)
TOTAL PROTEIN: 7.4 g/dL (ref 6.4–8.2)

## 2014-04-29 LAB — CBC
HCT: 38.8 % (ref 35.0–47.0)
HGB: 12.9 g/dL (ref 12.0–16.0)
MCH: 28.8 pg (ref 26.0–34.0)
MCHC: 33.1 g/dL (ref 32.0–36.0)
MCV: 87 fL (ref 80–100)
PLATELETS: 242 10*3/uL (ref 150–440)
RBC: 4.46 10*6/uL (ref 3.80–5.20)
RDW: 12.7 % (ref 11.5–14.5)
WBC: 10.7 10*3/uL (ref 3.6–11.0)

## 2014-04-29 LAB — URINALYSIS, COMPLETE
BILIRUBIN, UR: NEGATIVE
Blood: NEGATIVE
Glucose,UR: NEGATIVE mg/dL (ref 0–75)
Ketone: NEGATIVE
Nitrite: NEGATIVE
PH: 9 (ref 4.5–8.0)
Protein: NEGATIVE
Specific Gravity: 1.006 (ref 1.003–1.030)

## 2014-04-29 LAB — GC/CHLAMYDIA PROBE AMP

## 2014-04-29 LAB — HCG, QUANTITATIVE, PREGNANCY

## 2014-04-29 LAB — LIPASE, BLOOD: LIPASE: 103 U/L (ref 73–393)

## 2014-04-29 LAB — WET PREP, GENITAL

## 2014-04-30 LAB — URINE CULTURE

## 2014-05-01 LAB — CBC WITH DIFFERENTIAL/PLATELET
BASOS PCT: 0.2 %
Basophil #: 0 10*3/uL (ref 0.0–0.1)
EOS ABS: 0.1 10*3/uL (ref 0.0–0.7)
Eosinophil %: 2.5 %
HCT: 34.2 % — AB (ref 35.0–47.0)
HGB: 11.2 g/dL — AB (ref 12.0–16.0)
LYMPHS ABS: 2 10*3/uL (ref 1.0–3.6)
LYMPHS PCT: 38.2 %
MCH: 29 pg (ref 26.0–34.0)
MCHC: 32.7 g/dL (ref 32.0–36.0)
MCV: 89 fL (ref 80–100)
MONO ABS: 0.5 x10 3/mm (ref 0.2–0.9)
Monocyte %: 8.6 %
NEUTROS ABS: 2.7 10*3/uL (ref 1.4–6.5)
Neutrophil %: 50.5 %
Platelet: 203 10*3/uL (ref 150–440)
RBC: 3.86 10*6/uL (ref 3.80–5.20)
RDW: 12.7 % (ref 11.5–14.5)
WBC: 5.3 10*3/uL (ref 3.6–11.0)

## 2014-05-01 LAB — BASIC METABOLIC PANEL
Anion Gap: 3 — ABNORMAL LOW (ref 7–16)
BUN: 8 mg/dL (ref 7–18)
CHLORIDE: 110 mmol/L — AB (ref 98–107)
Calcium, Total: 8 mg/dL — ABNORMAL LOW (ref 8.5–10.1)
Co2: 28 mmol/L (ref 21–32)
Creatinine: 0.91 mg/dL (ref 0.60–1.30)
EGFR (Non-African Amer.): >60
Glucose: 85 mg/dL (ref 65–99)
Osmolality: 279 (ref 275–301)
POTASSIUM: 3.6 mmol/L (ref 3.5–5.1)
Sodium: 141 mmol/L (ref 136–145)

## 2014-06-09 ENCOUNTER — Ambulatory Visit: Payer: Self-pay | Admitting: Gastroenterology

## 2014-06-11 LAB — PATHOLOGY REPORT

## 2014-07-04 ENCOUNTER — Other Ambulatory Visit: Payer: Self-pay | Admitting: Gastroenterology

## 2014-07-04 LAB — CLOSTRIDIUM DIFFICILE(ARMC)

## 2014-08-18 ENCOUNTER — Encounter (HOSPITAL_COMMUNITY): Payer: Self-pay

## 2015-02-07 NOTE — Discharge Summary (Signed)
PATIENT NAME:  Haley Wong, Haley Wong MR#:  [id number] DATE OF BIRTH:  [birth year]  DATE OF ADMISSION:  04/29/2014 DATE OF DISCHARGE:  05/02/2014  PRIMARY CARE PHYSICIAN: Out of area.  DISCHARGE DIAGNOSES: 1.  Abdominal pain, nausea and vomiting due to sigmoid colitis.  2.  Tachycardia, resolved.   DISCHARGE MEDICATIONS:  1.  Metoprolol 25 mg extended-release once a day. 2.  Percocet 5/325 one tablet every 6 hours as needed for pain.  3.  Phenergan 25 mg every 6 hours as needed for nausea.  4.  Flagyl 500 mg p.o. t.i.d. for 10 days.  5.  Cipro 500 mg p.o. b.i.d. for 10 days.  DISCHARGE DIET: Regular.   HOSPITAL COURSE: The patient is a 29 year old female patient admitted on 14th of July because of abdominal pain, nausea and vomiting. The patient has history of asthma and interstitial cystitis and tachycardia and because of abdominal pain, nausea and vomiting she was admitted to hospitalist service. Initially we thought she had PID, pelvic inflammatory disease. The patient's CAT scan showed sigmoid colitis, and the patient was admitted to the hospitalist service. The patient was unable to keep anything down because of vomiting and abdominal pain. The patient received IV Cipro and Flagyl along with IV fluids. The patient also had a pelvic ultrasound which did not show any acute changes. It showed IUD in place. No focal abnormality. The patient also had chlamydia and trichomonas testing and then gonorrhea which were all negative. Urine pregnancy test was negative. The patient's blood and urine cultures were negative. The patient's white count was normal. On admission electrolytes were normal. Kidney function was normal. The patient's wet prep was normal with no clue cells. The patient continued to have stomach pain and the patient had abdominal x-ray done yesterday, which did not show any acute changes. The patient's white count today also is normal at 5.3. The patient also received ertapenem 1 gram every  24 hours since yesterday evening. The patient wanted to have gastroenterology evaluation as she was feeling better, tolerating some diet. She can go home and see GI as an outpatient. I did not think she needs GI evaluation as an inpatient. The patient is advised to continue antibiotics and see gastroenterologist as an outpatient. She was concerned that she had uterus infection. Explained that uterus looks fine on the CAT scan and cultures also were negative.  DISCHARGE VITALS: Temperature 98.2, heart rate 64, blood pressure 108/68, and sats 98% on room air.   TIME SPENT ON DISCHARGE PREPARATION: More than 30 minutes.   ____________________________ Katha HammingSnehalatha Roxy Filler, MD sk:sb D: 05/02/2014 09:37:32 ET T: 05/02/2014 09:52:03 ET JOB#: [id number]  cc: Katha HammingSnehalatha Gerarda Conklin, MD, <Dictator> Katha HammingSNEHALATHA Ameera Tigue MD ELECTRONICALLY SIGNED 05/09/2014 11:13

## 2015-02-07 NOTE — H&P (Signed)
PATIENT NAME:  Haley Wong, Haley Wong MR#:  [id number] DATE OF BIRTH:  1986-02-13  DATE OF ADMISSION:  04/29/2014  CHIEF COMPLAINT: Left lower quadrant abdominal pain and vomiting.   HISTORY OF PRESENT ILLNESS:  A 29 year old female patient with history of asthma, interstitial cystitis, and tachycardia who presents to the Emergency Room complaining of acute onset of left quadrant pain and vomiting since yesterday. She had a normal bowel movement earlier today, but it did not change her pain. Here in the Emergency Room, the patient initially was thought to have PID, was given antibiotics. Later, a CT scan of the abdomen was done which showed sigmoid colitis. The patient does not have any fever. Normal blood pressure and white count, but is being admitted as she is unable to keep any of her pills down. Also recurrent vomiting and significant pain in spite of optimum IV therapy and needs inpatient admission.   No sick contacts. No recent antibiotic use. Has not had any diarrhea. No history of inflammatory bowel disease.   PAST MEDICAL HISTORY:  1.  Asthma.  2.  Tachycardia.  3.  Interstitial cystitis.   FAMILY HISTORY: Mother and father are healthy. No chronic medical problems.   ALLERGIES: AMOXICILLIN WHICH CAUSES RASH AND THROAT SWELLING.   REVIEW OF SYSTEMS:  CONSTITUTIONAL: Complains of fatigue and weakness.  EYES: No blurry vision. EAR, NOSE, AND THROAT: No tinnitus, ear pain, or hearing loss.  RESPIRATORY: No cough, wheezing, or hemoptysis. CARDIOVASCULAR:  No chest pain or orthopnea.  GASTROINTESTINAL: Has nausea, vomiting,  and abdominal pain.  GENITOURINARY: No dysuria, hematuria, frequency.  ENDOCRINE:  No polyuria, nocturia, thyroid problems.  HEMATOLOGIC AND LYMPHATIC: No anemia, easy bruising, bleeding.  INTEGUMENTARY: No acne, rash, or lesion.  MUSCULOSKELETAL: No back pain or arthritis.  NEUROLOGIC: No focal numbness, weakness, or seizure.  PSYCHIATRIC: No anxiety or depression.    HOME MEDICATIONS: Metoprolol 25 mg oral once a day.   SOCIAL HISTORY: The patient is a smoker.  Very rare alcohol use. Works as a Chief Strategy Officer at [employer].   PHYSICAL EXAMINATION:  VITAL SIGNS: Temperature 97.9, pulse of 90 with blood pressure 120/62, saturation 100% on room air.  GENERAL: Moderately built Caucasian female patient lying in bed in significant distress secondary to her abdominal pain.  PSYCHIATRIC: Alert and oriented x 3. Mood and affect appropriate. Judgment intact.  HEENT: Atraumatic, normocephalic. Oral mucosa dry and pink. External ears and nose normal. No pallor. No icterus.  NECK: Supple. No thyromegaly. No palpable lymph nodes. Trachea midline. No JVD.  CARDIOVASCULAR: S1, S2, without any murmurs. Peripheral pulses 2+.  No edema. RESPIRATORY: Normal work of breathing. Clear to auscultation on both sides. GASTROINTESTINAL: Soft abdomen. Tenderness diffusely, mostly in the left lower quadrant area without any rigidity or guarding. Bowel sounds are present. No hepatosplenomegaly palpable.  SKIN: Warm and dry. No petechiae, rash, or ulcers.  MUSCULOSKELETAL: No joint swelling, redness in large joints. Normal muscle tone.  NEUROLOGICAL: Motor strength 5/5 in upper and lower extremities. Cranial nerves II-XII intact.  LYMPHATICS:  No lymphadenopathy.   LABORATORY STUDIES: Show urine pregnancy test negative.   Glucose 85, BUN 8, creatinine 0.84, sodium 140, potassium 3.5, chloride 110. AST, ALT, alkaline phosphatase and bilirubin normal.   WBC 10.7, hemoglobin 12.9, platelets of 242,000.   Chlamydia and gonorrhea test negative.   Urinalysis shows trace bacteria, only 1 WBC.   Pelvic ultrasound showed nothing acute with an IUD in place.   CT scan of the abdomen and pelvis with  contrast showed diffuse thickening of wall of the sigmoid colon with colitis.   ASSESSMENT AND PLAN: 1.  Sigmoid colitis with severe pain and vomiting. We will admit patient for IV pain  medications, IV antibiotics, and control of vomiting. The patient will be n.p.o. except medications at this point. Will need gastroenterology follow-up after discharge.   2.  Tachycardia. The patient seems to have baseline tachycardia on low-dose metoprolol. This will be continued.   3.  Asthma, p.r.n. inhaler.   4.  Deep vein thrombosis prophylaxis with Lovenox.   5.  CODE STATUS: FULL CODE.   TIME SPENT TODAY ON THIS CASE:  40 minutes.     ____________________________ Molinda BailiffSrikar R. Tricha Ruggirello, MD srs:ts D: 04/29/2014 16:00:57 ET T: 04/29/2014 16:39:32 ET JOB#: [id number]  cc: Wardell HeathSrikar R. Garrit Marrow, MD, <Dictator> Orie FishermanSRIKAR R Brevyn Ring MD ELECTRONICALLY SIGNED 04/29/2014 18:18

## 2015-12-30 ENCOUNTER — Ambulatory Visit
Admission: RE | Admit: 2015-12-30 | Discharge: 2015-12-30 | Disposition: A | Discharge status: Home / Self Care | Payer: BC Managed Care – PPO | Source: Ambulatory Visit | Attending: Nurse Practitioner | Admitting: Nurse Practitioner

## 2015-12-30 ENCOUNTER — Other Ambulatory Visit: Payer: Self-pay | Admitting: Nurse Practitioner

## 2015-12-30 DIAGNOSIS — R1011 Right upper quadrant pain: Secondary | ICD-10-CM | POA: Diagnosis present

## 2015-12-30 DIAGNOSIS — R1013 Epigastric pain: Secondary | ICD-10-CM | POA: Insufficient documentation

## 2015-12-30 DIAGNOSIS — R112 Nausea with vomiting, unspecified: Secondary | ICD-10-CM | POA: Insufficient documentation

## 2016-04-16 ENCOUNTER — Ambulatory Visit (INDEPENDENT_AMBULATORY_CARE_PROVIDER_SITE_OTHER): Payer: BC Managed Care – PPO

## 2016-04-16 ENCOUNTER — Encounter: Payer: Self-pay | Admitting: *Deleted

## 2016-04-16 ENCOUNTER — Ambulatory Visit
Admission: EM | Admit: 2016-04-16 | Discharge: 2016-04-16 | Disposition: A | Discharge status: Home / Self Care | Payer: BC Managed Care – PPO | Attending: Family Medicine | Admitting: Family Medicine

## 2016-04-16 DIAGNOSIS — S161XXA Strain of muscle, fascia and tendon at neck level, initial encounter: Secondary | ICD-10-CM

## 2016-04-16 DIAGNOSIS — S29009A Unspecified injury of muscle and tendon of unspecified wall of thorax, initial encounter: Secondary | ICD-10-CM | POA: Diagnosis not present

## 2016-04-16 DIAGNOSIS — S29019A Strain of muscle and tendon of unspecified wall of thorax, initial encounter: Secondary | ICD-10-CM

## 2016-04-16 LAB — PREGNANCY, URINE: PREG TEST UR: NEGATIVE

## 2016-04-16 MED ORDER — CYCLOBENZAPRINE HCL 10 MG PO TABS: 10.0000 mg | ORAL_TABLET | Freq: Three times a day (TID) | ORAL | Status: DC | PRN

## 2016-04-16 MED ORDER — HYDROCODONE-ACETAMINOPHEN 5-325 MG PO TABS: ORAL_TABLET | ORAL | Status: DC
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
## 2016-04-16 NOTE — Discharge Instructions (Signed)
Cervical Strain and Sprain With Rehab  Cervical strain and sprain are injuries that commonly occur with "whiplash" injuries. Whiplash occurs when the neck is forcefully whipped backward or forward, such as during a motor vehicle accident or during contact sports. The muscles, ligaments, tendons, discs, and nerves of the neck are susceptible to injury when this occurs.  RISK FACTORS  Risk of having a whiplash injury increases if:  · Osteoarthritis of the spine.  · Situations that make head or neck accidents or trauma more likely.  · High-risk sports (football, rugby, wrestling, hockey, auto racing, gymnastics, diving, contact karate, or boxing).  · Poor strength and flexibility of the neck.  · Previous neck injury.  · Poor tackling technique.  · Improperly fitted or padded equipment.  SYMPTOMS   · Pain or stiffness in the front or back of neck or both.  · Symptoms may present immediately or up to 24 hours after injury.  · Dizziness, headache, nausea, and vomiting.  · Muscle spasm with soreness and stiffness in the neck.  · Tenderness and swelling at the injury site.  PREVENTION  · Learn and use proper technique (avoid tackling with the head, spearing, and head-butting; use proper falling techniques to avoid landing on the head).  · Warm up and stretch properly before activity.  · Maintain physical fitness:    Strength, flexibility, and endurance.    Cardiovascular fitness.  · Wear properly fitted and padded protective equipment, such as padded soft collars, for participation in contact sports.  PROGNOSIS   Recovery from cervical strain and sprain injuries is dependent on the extent of the injury. These injuries are usually curable in 1 week to 3 months with appropriate treatment.   RELATED COMPLICATIONS   · Temporary numbness and weakness may occur if the nerve roots are damaged, and this may persist until the nerve has completely healed.  · Chronic pain due to frequent recurrence of symptoms.  · Prolonged healing,  especially if activity is resumed too soon (before complete recovery).  TREATMENT   Treatment initially involves the use of ice and medication to help reduce pain and inflammation. It is also important to perform strengthening and stretching exercises and modify activities that worsen symptoms so the injury does not get worse. These exercises may be performed at home or with a therapist. For patients who experience severe symptoms, a soft, padded collar may be recommended to be worn around the neck.   Improving your posture may help reduce symptoms. Posture improvement includes pulling your chin and abdomen in while sitting or standing. If you are sitting, sit in a firm chair with your buttocks against the back of the chair. While sleeping, try replacing your pillow with a small towel rolled to 2 inches in diameter, or use a cervical pillow or soft cervical collar. Poor sleeping positions delay healing.   For patients with nerve root damage, which causes numbness or weakness, the use of a cervical traction apparatus may be recommended. Surgery is rarely necessary for these injuries. However, cervical strain and sprains that are present at birth (congenital) may require surgery.  MEDICATION   · If pain medication is necessary, nonsteroidal anti-inflammatory medications, such as aspirin and ibuprofen, or other minor pain relievers, such as acetaminophen, are often recommended.  · Do not take pain medication for 7 days before surgery.  · Prescription pain relievers may be given if deemed necessary by your caregiver. Use only as directed and only as much as you need.    HEAT AND COLD:   · Cold treatment (icing) relieves pain and reduces inflammation. Cold treatment should be applied for 10 to 15 minutes every 2 to 3 hours for inflammation and pain and immediately after any activity that aggravates your symptoms. Use ice packs or an ice massage.  · Heat treatment may be used prior to performing the stretching and  strengthening activities prescribed by your caregiver, physical therapist, or athletic trainer. Use a heat pack or a warm soak.  SEEK MEDICAL CARE IF:   · Symptoms get worse or do not improve in 2 weeks despite treatment.  · New, unexplained symptoms develop (drugs used in treatment may produce side effects).  EXERCISES  RANGE OF MOTION (ROM) AND STRETCHING EXERCISES - Cervical Strain and Sprain  These exercises may help you when beginning to rehabilitate your injury. In order to successfully resolve your symptoms, you must improve your posture. These exercises are designed to help reduce the forward-head and rounded-shoulder posture which contributes to this condition. Your symptoms may resolve with or without further involvement from your physician, physical therapist or athletic trainer. While completing these exercises, remember:   · Restoring tissue flexibility helps normal motion to return to the joints. This allows healthier, less painful movement and activity.  · An effective stretch should be held for at least 20 seconds, although you may need to begin with shorter hold times for comfort.  · A stretch should never be painful. You should only feel a gentle lengthening or release in the stretched tissue.  STRETCH- Axial Extensors  · Lie on your back on the floor. You may bend your knees for comfort. Place a rolled-up hand towel or dish towel, about 2 inches in diameter, under the part of your head that makes contact with the floor.  · Gently tuck your chin, as if trying to make a "double chin," until you feel a gentle stretch at the base of your head.  · Hold __________ seconds.  Repeat __________ times. Complete this exercise __________ times per day.   STRETCH - Axial Extension   · Stand or sit on a firm surface. Assume a good posture: chest up, shoulders drawn back, abdominal muscles slightly tense, knees unlocked (if standing) and feet hip width apart.  · Slowly retract your chin so your head slides back  and your chin slightly lowers. Continue to look straight ahead.  · You should feel a gentle stretch in the back of your head. Be certain not to feel an aggressive stretch since this can cause headaches later.  · Hold for __________ seconds.  Repeat __________ times. Complete this exercise __________ times per day.  STRETCH - Cervical Side Bend   · Stand or sit on a firm surface. Assume a good posture: chest up, shoulders drawn back, abdominal muscles slightly tense, knees unlocked (if standing) and feet hip width apart.  · Without letting your nose or shoulders move, slowly tip your right / left ear to your shoulder until your feel a gentle stretch in the muscles on the opposite side of your neck.  · Hold __________ seconds.  Repeat __________ times. Complete this exercise __________ times per day.  STRETCH - Cervical Rotators   · Stand or sit on a firm surface. Assume a good posture: chest up, shoulders drawn back, abdominal muscles slightly tense, knees unlocked (if standing) and feet hip width apart.  · Keeping your eyes level with the ground, slowly turn your head until you feel a gentle stretch along   the back and opposite side of your neck.  · Hold __________ seconds.  Repeat __________ times. Complete this exercise __________ times per day.  RANGE OF MOTION - Neck Circles   · Stand or sit on a firm surface. Assume a good posture: chest up, shoulders drawn back, abdominal muscles slightly tense, knees unlocked (if standing) and feet hip width apart.  · Gently roll your head down and around from the back of one shoulder to the back of the other. The motion should never be forced or painful.  · Repeat the motion 10-20 times, or until you feel the neck muscles relax and loosen.  Repeat __________ times. Complete the exercise __________ times per day.  STRENGTHENING EXERCISES - Cervical Strain and Sprain  These exercises may help you when beginning to rehabilitate your injury. They may resolve your symptoms with or  without further involvement from your physician, physical therapist, or athletic trainer. While completing these exercises, remember:   · Muscles can gain both the endurance and the strength needed for everyday activities through controlled exercises.  · Complete these exercises as instructed by your physician, physical therapist, or athletic trainer. Progress the resistance and repetitions only as guided.  · You may experience muscle soreness or fatigue, but the pain or discomfort you are trying to eliminate should never worsen during these exercises. If this pain does worsen, stop and make certain you are following the directions exactly. If the pain is still present after adjustments, discontinue the exercise until you can discuss the trouble with your clinician.  STRENGTH - Cervical Flexors, Isometric  · Face a wall, standing about 6 inches away. Place a small pillow, a ball about 6-8 inches in diameter, or a folded towel between your forehead and the wall.  · Slightly tuck your chin and gently push your forehead into the soft object. Push only with mild to moderate intensity, building up tension gradually. Keep your jaw and forehead relaxed.  · Hold 10 to 20 seconds. Keep your breathing relaxed.  · Release the tension slowly. Relax your neck muscles completely before you start the next repetition.  Repeat __________ times. Complete this exercise __________ times per day.  STRENGTH- Cervical Lateral Flexors, Isometric   · Stand about 6 inches away from a wall. Place a small pillow, a ball about 6-8 inches in diameter, or a folded towel between the side of your head and the wall.  · Slightly tuck your chin and gently tilt your head into the soft object. Push only with mild to moderate intensity, building up tension gradually. Keep your jaw and forehead relaxed.  · Hold 10 to 20 seconds. Keep your breathing relaxed.  · Release the tension slowly. Relax your neck muscles completely before you start the next  repetition.  Repeat __________ times. Complete this exercise __________ times per day.  STRENGTH - Cervical Extensors, Isometric   · Stand about 6 inches away from a wall. Place a small pillow, a ball about 6-8 inches in diameter, or a folded towel between the back of your head and the wall.  · Slightly tuck your chin and gently tilt your head back into the soft object. Push only with mild to moderate intensity, building up tension gradually. Keep your jaw and forehead relaxed.  · Hold 10 to 20 seconds. Keep your breathing relaxed.  · Release the tension slowly. Relax your neck muscles completely before you start the next repetition.  Repeat __________ times. Complete this exercise __________ times per day.    POSTURE AND BODY MECHANICS CONSIDERATIONS - Cervical Strain and Sprain  Keeping correct posture when sitting, standing or completing your activities will reduce the stress put on different body tissues, allowing injured tissues a chance to heal and limiting painful experiences. The following are general guidelines for improved posture. Your physician or physical therapist will provide you with any instructions specific to your needs. While reading these guidelines, remember:  · The exercises prescribed by your provider will help you have the flexibility and strength to maintain correct postures.  · The correct posture provides the optimal environment for your joints to work. All of your joints have less wear and tear when properly supported by a spine with good posture. This means you will experience a healthier, less painful body.  · Correct posture must be practiced with all of your activities, especially prolonged sitting and standing. Correct posture is as important when doing repetitive low-stress activities (typing) as it is when doing a single heavy-load activity (lifting).  PROLONGED STANDING WHILE SLIGHTLY LEANING FORWARD  When completing a task that requires you to lean forward while standing in one  place for a long time, place either foot up on a stationary 2- to 4-inch high object to help maintain the best posture. When both feet are on the ground, the low back tends to lose its slight inward curve. If this curve flattens (or becomes too large), then the back and your other joints will experience too much stress, fatigue more quickly, and can cause pain.   RESTING POSITIONS  Consider which positions are most painful for you when choosing a resting position. If you have pain with flexion-based activities (sitting, bending, stooping, squatting), choose a position that allows you to rest in a less flexed posture. You would want to avoid curling into a fetal position on your side. If your pain worsens with extension-based activities (prolonged standing, working overhead), avoid resting in an extended position such as sleeping on your stomach. Most people will find more comfort when they rest with their spine in a more neutral position, neither too rounded nor too arched. Lying on a non-sagging bed on your side with a pillow between your knees, or on your back with a pillow under your knees will often provide some relief. Keep in mind, being in any one position for a prolonged period of time, no matter how correct your posture, can still lead to stiffness.  WALKING  Walk with an upright posture. Your ears, shoulders, and hips should all line up.  OFFICE WORK  When working at a desk, create an environment that supports good, upright posture. Without extra support, muscles fatigue and lead to excessive strain on joints and other tissues.  CHAIR:  · A chair should be able to slide under your desk when your back makes contact with the back of the chair. This allows you to work closely.  · The chair's height should allow your eyes to be level with the upper part of your monitor and your hands to be slightly lower than your elbows.  · Body position:    Your feet should make contact with the floor. If this is not  possible, use a foot rest.    Keep your ears over your shoulders. This will reduce stress on your neck and low back.     This information is not intended to replace advice given to you by your health care provider. Make sure you discuss any questions you have with your health care provider.       Document Released: 10/03/2005 Document Revised: 10/24/2014 Document Reviewed: 01/15/2009  [employer] Interactive Patient Education ©2016 [employer]
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
## 2016-10-25 ENCOUNTER — Ambulatory Visit (INDEPENDENT_AMBULATORY_CARE_PROVIDER_SITE_OTHER): Payer: BC Managed Care – PPO | Admitting: Certified Nurse Midwife

## 2016-10-25 ENCOUNTER — Encounter: Payer: Self-pay | Admitting: Certified Nurse Midwife

## 2016-10-25 ENCOUNTER — Other Ambulatory Visit: Payer: Self-pay | Admitting: Certified Nurse Midwife

## 2016-10-25 VITALS — BP 123/70 | HR 76 | Ht 64.5 in | Wt 199.0 lb

## 2016-10-25 DIAGNOSIS — Z01419 Encounter for gynecological examination (general) (routine) without abnormal findings: Secondary | ICD-10-CM | POA: Diagnosis not present

## 2016-10-25 NOTE — Progress Notes (Signed)
ANNUAL PREVENTATIVE CARE GYN  ENCOUNTER NOTE  Subjective:       Haley Wong is a 31 y.o. 1002P2002 female here for a routine annual gynecologic exam.  Current complaints: irregular periods, temperature changes, and acne.   Haley Wong presents to establish care. She comes to [facility] after several positive patient recommendations.  Over the last year, she has noticed an increase in forgetfulness, acne, sweating, migraines, and anxiety. Her anxiety and migraines are managed by her PCP.   Family history significant for hypothyroidism and early menopause.   Gynecologic History Patient's last menstrual period was 10/18/2016 (exact date).   Contraception: coitus interruptus   Last Pap: 2-3 years ago. Results were: normal  Obstetric History OB History  Gravida Para Term Preterm AB Living  2 2 2     2   SAB TAB Ectopic Multiple Live Births          2    # Outcome Date GA Lbr Len/2nd Weight Sex Delivery Anes PTL Lv  2 Term 03/04/13 6276w2d 07:42 / 00:18  F Vag-Spont EPI  LIV  1 Term 2012 5451w0d 13:00 7 lb (3.175 kg) M Vag-Spont EPI  LIV      Past Medical History:  Diagnosis Date  . Anemia   . Anxiety    uses albuterol prn for SOB caused by anxiety  . Asthma   . Chronic interstitial cystitis   . Dysrhythmia    tachycardia on B blockers  . GERD (gastroesophageal reflux disease)   . Headache(784.0)   . Hx of gastric ulcer   . Hx of varicella   . IBS (irritable bowel syndrome)   . Other hemoglobinopathies (HCC)   . Tachycardia    take metoprolol 25mg  daily since 31 yo  . Unspecified hemorrhoids without mention of complication     Past Surgical History:  Procedure Laterality Date  . LAPAROSCOPY  2009   exploratory surgery, diagnosed interstitial cystitis    Current Outpatient Prescriptions on File Prior to Visit  Medication Sig Dispense Refill  . albuterol (PROVENTIL HFA;VENTOLIN HFA) 108 (90 BASE) MCG/ACT inhaler Inhale 2 puffs into the lungs every 6 (six) hours as  needed for wheezing or shortness of breath.    . metoprolol succinate (TOPROL-XL) 25 MG 24 hr tablet Take 25 mg by mouth at bedtime.     No current facility-administered medications on file prior to visit.     Allergies  Allergen Reactions  . Latex Rash  . Amoxicillin   . Pineapple Other (See Comments)    Throat and tongue swells    Social History   Social History  . Marital status: Married    Spouse name: N/A  . Number of children: N/A  . Years of education: N/A   Occupational History  . Not on file.   Social History Main Topics  . Smoking status: Never Smoker  . Smokeless tobacco: Never Used  . Alcohol use Yes     Comment: occas  . Drug use: No  . Sexual activity: Yes    Birth control/ protection: None   Other Topics Concern  . Not on file   Social History Narrative  . No narrative on file    Family History  Problem Relation Age of Onset  . Hypothyroidism Maternal Aunt   . Diabetes Maternal Grandmother   . Kidney disease Maternal Grandmother   . Hypothyroidism Maternal Grandmother   . Diabetes Maternal Grandfather   . Hypertension Maternal Grandfather   . Heart attack Maternal Grandfather   .  Hypertension Paternal Grandfather   . Hypothyroidism Maternal Aunt   . Hypothyroidism Maternal Aunt   . Hypothyroidism Maternal Aunt     The following portions of the patient's history were reviewed and updated as appropriate: allergies, current medications, past family history, past medical history, past social history, past surgical history and problem list.  Review of Systems ROS Review of Systems -   General ROS: negative for - chills, fatigue, fever, weight gain or weight loss  Psychological ROS: negative for -decreased libido, depression, mood swings, physical abuse or sexual abuse  ENT ROS: negative for - hearing change, visual changes or vocal changes  Hematological and Lymphatic ROS: negative for - bleeding problems, bruising, swollen lymph nodes or  weight loss  Endocrine ROS: negative for - galactorrhea, hair pattern changes, malaise/lethargy, mood swings, palpitations, polydipsia/polyuria, skin changes, or unexpected weight changes  Breast ROS: negative for - new or changing breast lumps or nipple discharge  Respiratory ROS: negative for - cough or shortness of breath  Cardiovascular ROS: negative for - chest pain, irregular heartbeat, palpitations or shortness of breath  Gastrointestinal ROS: no abdominal pain, change in bowel habits, or black or bloody stools  Genito-Urinary ROS: no dysuria, trouble voiding, or hematuria  Musculoskeletal ROS: negative for - joint pain or joint stiffness  Neurological ROS: negative for - bowel and bladder control changes  Dermatological ROS: negative for rash and skin lesion changes   Objective:   BP 123/70   Pulse 76   Ht 5' 4.5" (1.638 m)   Wt 199 lb (90.3 kg)   LMP 10/18/2016 (Exact Date)   BMI 33.63 kg/m  CONSTITUTIONAL: Well-developed, well-nourished female in no acute distress.   PSYCHIATRIC: Normal mood and affect. Normal behavior. Normal judgment and thought content.  NEUROLGIC: Alert and oriented to person, place, and time. Normal muscle tone coordination.  HENT:  Normocephalic, atraumatic, External right and left ear normal.   NECK: Normal range of motion, supple, no masses.  Normal thyroid.   SKIN: Skin is warm and dry. No rash noted. Not diaphoretic. No erythema. No pallor.  CARDIOVASCULAR: Normal heart rate noted, regular rhythm, no murmur.  RESPIRATORY: Clear to auscultation bilaterally. Effort and breath sounds normal.  BREASTS: Symmetric in size. No masses, skin changes, nipple drainage, or lymphadenopathy.  ABDOMEN: Soft, normal bowel sounds, no distention noted.  No tenderness, rebound or guarding.   PELVIC:  External Genitalia: Normal  Vagina: Normal  Cervix: Normal  Uterus: Normal  Adnexa: Normal  RV: Not examined   MUSCULOSKELETAL: Normal range of  motion. No tenderness.  No edema.  2+ distal pulses.  Assessment:   Annual gynecologic examination 30 y.o. Contraception: coitus interruptus Obesity 1 Problem List Items Addressed This Visit    None      Plan:  1. Pap: Cytology only   2. Labs: CBC, CMP, Prolactin, Testerone, LH/FSH, Magnesium, Lipid 1, TSH, Hemoglobin A1C and Vit D Level""-Return when fasting  3. Routine preventative health maintenance measures emphasized: Exercise/Diet/Weight control   4. RTC X 1 year for Annual exam or sooner if needed   Gunnar Bulla, CNM   Gunnar Bulla, CNM

## 2016-10-25 NOTE — Progress Notes (Signed)
Pt is here for a pap smear. Last pap smear was approx. 2-3 years ago. WNL. Is c/o irregular periods.

## 2016-10-26 ENCOUNTER — Other Ambulatory Visit: Payer: BC Managed Care – PPO

## 2016-10-26 NOTE — Addendum Note (Signed)
Addended by: Lunette StandsSIEMIENSKI, Chelsa Stout J on: 10/26/2016 10:04 AM   Modules accepted: Orders

## 2016-10-27 ENCOUNTER — Telehealth: Payer: Self-pay | Admitting: Certified Nurse Midwife

## 2016-10-27 LAB — CBC WITH DIFFERENTIAL/PLATELET
Basophils Absolute: 0 10*3/uL (ref 0.0–0.2)
Basos: 0 %
EOS (ABSOLUTE): 0.1 10*3/uL (ref 0.0–0.4)
EOS: 1 %
HEMATOCRIT: 40.2 % (ref 34.0–46.6)
Hemoglobin: 13.3 g/dL (ref 11.1–15.9)
Immature Grans (Abs): 0 10*3/uL (ref 0.0–0.1)
Immature Granulocytes: 0 %
LYMPHS ABS: 1.7 10*3/uL (ref 0.7–3.1)
Lymphs: 33 %
MCH: 29.3 pg (ref 26.6–33.0)
MCHC: 33.1 g/dL (ref 31.5–35.7)
MCV: 89 fL (ref 79–97)
MONOS ABS: 0.5 10*3/uL (ref 0.1–0.9)
Monocytes: 9 %
Neutrophils Absolute: 2.8 10*3/uL (ref 1.4–7.0)
Neutrophils: 57 %
PLATELETS: 306 10*3/uL (ref 150–379)
RBC: 4.54 x10E6/uL (ref 3.77–5.28)
RDW: 13.5 % (ref 12.3–15.4)
WBC: 5.1 10*3/uL (ref 3.4–10.8)

## 2016-10-27 LAB — COMPREHENSIVE METABOLIC PANEL
ALT: 24 IU/L (ref 0–32)
AST: 19 IU/L (ref 0–40)
Albumin/Globulin Ratio: 1.6 (ref 1.2–2.2)
Albumin: 4.5 g/dL (ref 3.5–5.5)
Alkaline Phosphatase: 72 IU/L (ref 39–117)
BUN/Creatinine Ratio: 19 (ref 9–23)
BUN: 14 mg/dL (ref 6–20)
Bilirubin Total: 0.5 mg/dL (ref 0.0–1.2)
CO2: 23 mmol/L (ref 18–29)
CREATININE: 0.72 mg/dL (ref 0.57–1.00)
Calcium: 9.3 mg/dL (ref 8.7–10.2)
Chloride: 105 mmol/L (ref 96–106)
GFR calc Af Amer: 131 mL/min/{1.73_m2} (ref >59)
GFR calc non Af Amer: 114 mL/min/{1.73_m2} (ref >59)
GLUCOSE: 73 mg/dL (ref 65–99)
Globulin, Total: 2.8 g/dL (ref 1.5–4.5)
Potassium: 4.8 mmol/L (ref 3.5–5.2)
Sodium: 142 mmol/L (ref 134–144)
Total Protein: 7.3 g/dL (ref 6.0–8.5)

## 2016-10-27 LAB — LIPID PANEL
CHOL/HDL RATIO: 3.2 ratio (ref 0.0–4.4)
Cholesterol, Total: 175 mg/dL (ref 100–199)
HDL: 55 mg/dL (ref >39)
LDL Calculated: 109 mg/dL — ABNORMAL HIGH (ref 0–99)
TRIGLYCERIDES: 55 mg/dL (ref 0–149)
VLDL Cholesterol Cal: 11 mg/dL (ref 5–40)

## 2016-10-27 LAB — VITAMIN D 25 HYDROXY (VIT D DEFICIENCY, FRACTURES): Vit D, 25-Hydroxy: 27.1 ng/mL — ABNORMAL LOW (ref 30.0–100.0)

## 2016-10-27 LAB — MAGNESIUM: Magnesium: 2.1 mg/dL (ref 1.6–2.3)

## 2016-10-27 LAB — PROLACTIN: PROLACTIN: 19.2 ng/mL (ref 4.8–23.3)

## 2016-10-27 LAB — TSH: TSH: 1.21 u[IU]/mL (ref 0.450–4.500)

## 2016-10-27 LAB — FSH/LH
FSH: 5.6 m[IU]/mL
LH: 7.1 m[IU]/mL

## 2016-10-27 LAB — HEMOGLOBIN A1C
Est. average glucose Bld gHb Est-mCnc: 94 mg/dL
HEMOGLOBIN A1C: 4.9 % (ref 4.8–5.6)

## 2016-10-27 LAB — TESTOSTERONE, FREE, TOTAL, SHBG
SEX HORMONE BINDING: 34.8 nmol/L (ref 24.6–122.0)
TESTOSTERONE FREE: 3.3 pg/mL (ref 0.0–4.2)
TESTOSTERONE: 49 ng/dL — AB (ref 8–48)

## 2016-10-27 MED ORDER — VITAMIN D (ERGOCALCIFEROL) 1.25 MG (50000 UNIT) PO CAPS: 50000.0000 [IU] | ORAL_CAPSULE | ORAL | 1 refills | Status: DC

## 2016-10-27 NOTE — Telephone Encounter (Signed)
-----   Message from Gunnar BullaJenkins Michelle Lawhorn, CNM sent at 10/27/2016  1:18 PM EST ----- Please send vitamin D deficiency paperwork to patient.   Thanks, JML

## 2016-10-27 NOTE — Telephone Encounter (Signed)
Mailed requested information

## 2016-10-27 NOTE — Telephone Encounter (Signed)
Called pt, verified full name and date of birth.   Lab results reviewed. Discussed low vitamin D. Rx for supplementation. Recheck level in three (3) months.   Pt verbalized understanding.    Gunnar BullaJenkins Michelle Shamone Winzer, CNM

## 2016-10-28 ENCOUNTER — Telehealth: Payer: Self-pay

## 2016-10-28 LAB — CYTOLOGY - PAP

## 2016-10-28 NOTE — Telephone Encounter (Signed)
-----   Message from Gunnar BullaJenkins Michelle Lawhorn, CNM sent at 10/28/2016  1:47 PM EST ----- She isn't signed up for MyChart yet. Will you let her know that her PAP was negative. And that you mailed her Vitamin D info.  Thanks, JML

## 2016-10-28 NOTE — Progress Notes (Signed)
She isn't signed up for MyChart yet. Will you let her know that her PAP was negative. And that you mailed her Vitamin D info.  Thanks, JML

## 2016-10-28 NOTE — Telephone Encounter (Signed)
Spoke with pt- informed her of neg result and paperwork that was mailed to her.

## 2017-03-29 ENCOUNTER — Ambulatory Visit (INDEPENDENT_AMBULATORY_CARE_PROVIDER_SITE_OTHER): Payer: 59 | Admitting: Obstetrics and Gynecology

## 2017-03-29 ENCOUNTER — Encounter: Payer: Self-pay | Admitting: Obstetrics and Gynecology

## 2017-03-29 VITALS — BP 104/73 | HR 67 | Ht 63.0 in | Wt 183.0 lb

## 2017-03-29 DIAGNOSIS — E559 Vitamin D deficiency, unspecified: Secondary | ICD-10-CM | POA: Diagnosis not present

## 2017-03-29 DIAGNOSIS — R419 Unspecified symptoms and signs involving cognitive functions and awareness: Secondary | ICD-10-CM | POA: Diagnosis not present

## 2017-03-29 DIAGNOSIS — E669 Obesity, unspecified: Secondary | ICD-10-CM | POA: Diagnosis not present

## 2017-03-29 DIAGNOSIS — G479 Sleep disorder, unspecified: Secondary | ICD-10-CM

## 2017-03-29 DIAGNOSIS — F988 Other specified behavioral and emotional disorders with onset usually occurring in childhood and adolescence: Secondary | ICD-10-CM

## 2017-03-29 MED ORDER — VITAMIN D (ERGOCALCIFEROL) 1.25 MG (50000 UNIT) PO CAPS: 50000.0000 [IU] | ORAL_CAPSULE | ORAL | 1 refills | Status: DC

## 2017-03-29 MED ORDER — TRIAZOLAM 0.25 MG PO TABS: 0.2500 mg | ORAL_TABLET | Freq: Every evening | ORAL | 1 refills | Status: DC | PRN

## 2017-03-29 MED ORDER — AMPHETAMINE-DEXTROAMPHETAMINE 7.5 MG PO TABS: 7.5000 mg | ORAL_TABLET | Freq: Two times a day (BID) | ORAL | 0 refills | Status: DC

## 2017-03-29 NOTE — Progress Notes (Signed)
Subjective:     Patient ID: Haley Wong, female   DOB: [birth year], 30 y.o.   MRN: [id number]  HPI Feels not focusing and all over the place for about a year.   Can't fall asleep well due to feet burning at night.goes to bed around ten pm and wakes up multiple times at night, then has trouble waking up. Getting to work.  Gets really aggitated easily. 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 Happy with job change, but can't focus well. States she was told she had ADD as child, never medicated.   Has been working hard to lose weight, lost 20# over six months and unable to lose more since February. Not exercising right now.   Menses are monthly and down to 3 days (used to be 5-7 days) onset menarche at age 46. Sex drive up and down. Not on BC at this time, felt worse when taking it. Spouse just had vasectomy.  Feels flu like symptoms week before menses.  Sweating excessively and feels like has an onion odor.    Seen PCP for it but doesn't feel like he is listening. Has been on prozac for 1 year and does not feel any better, migraine are 1-2 a month.   Review of Systems   Depression screen [facility] 2/9 03/29/2017  Decreased Interest 1  Down, Depressed, Hopeless 1  PHQ - 2 Score 2  Altered sleeping 2  Tired, decreased energy 2  Change in appetite 0  Feeling bad or failure about yourself  1  Trouble concentrating 3  Moving slowly or fidgety/restless 0  Suicidal thoughts 0  PHQ-9 Score 10  Difficult doing work/chores Somewhat difficult      Objective:   Physical Exam A&O x4 Well groomed female in no distress, tearful when discussing concerns, thought processes and memory intact. Blood pressure 104/73, pulse 67, height '5\' 3"'$  (1.6 m), weight 183 lb (83 kg), last menstrual period 03/17/2017.  Body mass index is 32.42 kg/m.  Thyroid not enlarged. HRR. Pelvic exam not indicated    Assessment:     Vitamin d deficiency ADD Obesity Sleep disturbance      Plan:     Counseled at length over common causes of current symptoms and treatment options. Will recheck vit D lab today, and instructed to restart it, and continue until we get levels above 50.  To stop SSRI as it has not helped.  To add Halcion 0.'25mg'$  at bedtime for 2 weeks, then at bedtime the nights before work x 1 month, then take as needed.   To start trial of adderal, rx given, instructed to take one tablet in am for 3-5 days then add lunch time dose. Will message me in MyChart in 3 weeks and let me know how she is sleeping and feeling and will follow up accordingly.  > 50% of 25 minute visit spent in counseling on above.  Haley Wong, CNM

## 2017-03-30 ENCOUNTER — Telehealth: Payer: Self-pay | Admitting: Certified Nurse Midwife

## 2017-03-30 LAB — VITAMIN D 25 HYDROXY (VIT D DEFICIENCY, FRACTURES): VIT D 25 HYDROXY: 30.4 ng/mL (ref 30.0–100.0)

## 2017-03-30 NOTE — Telephone Encounter (Signed)
Notified pt I sent all info to [facility], she can call her insurance company and see if it has been approved

## 2017-03-30 NOTE — Telephone Encounter (Signed)
°  Patient called because she needs a pre-authorization for her medication before it can be picked up. Patient would like to be called back and have the problem fixed so she is able to get medication. Please advise.

## 2017-05-01 ENCOUNTER — Encounter: Payer: Self-pay | Admitting: Obstetrics and Gynecology

## 2017-05-02 ENCOUNTER — Other Ambulatory Visit: Payer: Self-pay | Admitting: Obstetrics and Gynecology

## 2017-05-02 MED ORDER — AMPHETAMINE-DEXTROAMPHET ER 10 MG PO CP24: 10.0000 mg | ORAL_CAPSULE | Freq: Every day | ORAL | 0 refills | Status: DC

## 2017-06-06 ENCOUNTER — Telehealth: Payer: Self-pay | Admitting: Obstetrics and Gynecology

## 2017-06-06 ENCOUNTER — Other Ambulatory Visit: Payer: Self-pay | Admitting: Obstetrics and Gynecology

## 2017-06-06 ENCOUNTER — Encounter: Payer: Self-pay | Admitting: Obstetrics and Gynecology

## 2017-06-06 MED ORDER — AMPHETAMINE-DEXTROAMPHET ER 10 MG PO CP24: 10.0000 mg | ORAL_CAPSULE | Freq: Every day | ORAL | 0 refills | Status: DC

## 2017-06-06 NOTE — Telephone Encounter (Signed)
Pt contacted office for refill request on the following medications: amphetamine-dextroamphetamine (ADDERALL XR) 10 MG 24 hr capsule   Last Rx: 05/02/17 LOV: 03/29/17 Please advise. Thanks TNP

## 2017-07-05 ENCOUNTER — Encounter: Payer: Self-pay | Admitting: Obstetrics and Gynecology

## 2017-07-10 ENCOUNTER — Other Ambulatory Visit: Payer: Self-pay | Admitting: Obstetrics and Gynecology

## 2017-07-10 MED ORDER — VITAMIN D3 125 MCG (5000 UT) PO CAPS: 1.0000 | ORAL_CAPSULE | Freq: Every day | ORAL | 2 refills | Status: DC

## 2017-07-10 MED ORDER — FLUOXETINE HCL 10 MG PO CAPS: 10.0000 mg | ORAL_CAPSULE | Freq: Every day | ORAL | 2 refills | Status: DC

## 2017-08-03 ENCOUNTER — Encounter: Payer: Self-pay | Admitting: Obstetrics and Gynecology

## 2017-08-04 ENCOUNTER — Other Ambulatory Visit: Payer: Self-pay | Admitting: *Deleted

## 2017-08-04 MED ORDER — METOPROLOL SUCCINATE ER 25 MG PO TB24: 25.0000 mg | ORAL_TABLET | Freq: Every day | ORAL | 3 refills | Status: DC

## 2017-09-20 ENCOUNTER — Ambulatory Visit: Payer: BLUE CROSS/BLUE SHIELD | Admitting: Obstetrics and Gynecology

## 2017-09-20 ENCOUNTER — Encounter: Payer: Self-pay | Admitting: Obstetrics and Gynecology

## 2017-09-20 VITALS — BP 110/80 | HR 93 | Ht 63.0 in | Wt 180.3 lb

## 2017-09-20 DIAGNOSIS — G479 Sleep disorder, unspecified: Secondary | ICD-10-CM

## 2017-09-20 DIAGNOSIS — E669 Obesity, unspecified: Secondary | ICD-10-CM

## 2017-09-20 DIAGNOSIS — F988 Other specified behavioral and emotional disorders with onset usually occurring in childhood and adolescence: Secondary | ICD-10-CM

## 2017-09-20 DIAGNOSIS — Z79899 Other long term (current) drug therapy: Secondary | ICD-10-CM

## 2017-09-20 MED ORDER — AMPHETAMINE-DEXTROAMPHETAMINE 15 MG PO TABS: 15.0000 mg | ORAL_TABLET | Freq: Every day | ORAL | 0 refills | Status: DC

## 2017-09-20 MED ORDER — AMPHETAMINE-DEXTROAMPHETAMINE 10 MG PO TABS: 10.0000 mg | ORAL_TABLET | Freq: Every day | ORAL | 0 refills | Status: DC

## 2017-09-20 MED ORDER — RIZATRIPTAN BENZOATE 10 MG PO TABS: 10.0000 mg | ORAL_TABLET | ORAL | 2 refills | Status: DC | PRN

## 2017-09-20 MED ORDER — TRIAZOLAM 0.125 MG PO TABS: 0.1250 mg | ORAL_TABLET | Freq: Every evening | ORAL | 0 refills | Status: DC | PRN

## 2017-09-20 MED ORDER — FLUOXETINE HCL 20 MG PO CAPS: 20.0000 mg | ORAL_CAPSULE | Freq: Every day | ORAL | 3 refills | Status: DC

## 2017-09-20 NOTE — Progress Notes (Signed)
Subjective:     Patient ID: Haley Wong, female   DOB: November 16, 1985, 31 y.o.   MRN: [id number]  HPI Here for follow up on depression/anxiety medications and  ADD medication.  States adderal is working well, but keeps her from falling asleep with increase in dose and XR tablets.  States she still feels all over the place and easily distracted and jittery on the inside. 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 So feels things are better with new company. Still adjusting.  Feels 25% better since starting the prozac, stating calmer. Does report all over body aches and fatigue. Easily aggitated when at home with children. Feels like things irritate her that would irritate others (like child rubbing foot repeatatively on floor).   Tried the sleep medication and it worked well. But made it difficult to get up in the morning. Ran out of meds and has been taking OTC. Still feels sleepy some mornings but only when she doesn't get a full nights sleep, which varies.   Feet still burn after wearing closed shoes- makes her frustrated and angry. Flip flops are better.  Is now having restless legs at night on the nights she is stressed out. Keeps her from falling alseep at times.  Review of Systems Negative except stated above in HPI    Objective:   Physical Exam A&Ox4 Well groomed female, slightly anxious with flight of though during review. Vitals:   09/20/17 0848  Weight: 180 lb 4.8 oz (81.8 kg)  Height: 5\' 3"  (1.6 m)   scored a 43 on ADD scale. Appropriate affect. PE not indicated.     Assessment:     ADD  sleep disturbance Restless leg Depression with anxiety Obesity      Plan:     Will increased prozac to 20mg  daily, and let me know in one month how she is feeling. Changed adderal from XR to 15mg  in am and 10mg  in afternoon. To add tonic water on nights legs are restless. Will decrease triazolam to 0.125mg  and prn use.  Encouraged stress relief  practices and good sleep hygeine.  >50% of 15 minute appointment spent in counseling.  Rafal Archuleta,CNM

## 2017-09-21 ENCOUNTER — Telehealth: Payer: Self-pay | Admitting: *Deleted

## 2017-09-21 NOTE — Telephone Encounter (Signed)
Patient called and states that she was prescribed a RX Adderall and patient has a questions about the medication  . Patient is requesting a call back. Her contact # [phone]. Please advise. Thank you

## 2017-09-22 NOTE — Telephone Encounter (Signed)
Done-ac 

## 2017-10-19 ENCOUNTER — Encounter: Payer: Self-pay | Admitting: Obstetrics and Gynecology

## 2017-10-19 ENCOUNTER — Other Ambulatory Visit: Payer: Self-pay | Admitting: Obstetrics and Gynecology

## 2017-10-19 MED ORDER — TRIAZOLAM 0.125 MG PO TABS: 0.1250 mg | ORAL_TABLET | Freq: Every evening | ORAL | 2 refills | Status: DC | PRN

## 2017-10-19 MED ORDER — FLUOXETINE HCL 20 MG PO CAPS: 20.0000 mg | ORAL_CAPSULE | Freq: Every day | ORAL | 3 refills | Status: DC

## 2017-10-19 MED ORDER — AMPHETAMINE-DEXTROAMPHETAMINE 15 MG PO TABS: 15.0000 mg | ORAL_TABLET | Freq: Every day | ORAL | 0 refills | Status: DC

## 2017-10-19 MED ORDER — AMPHETAMINE-DEXTROAMPHETAMINE 10 MG PO TABS: 10.0000 mg | ORAL_TABLET | Freq: Every day | ORAL | 0 refills | Status: DC

## 2017-11-03 ENCOUNTER — Ambulatory Visit: Payer: BLUE CROSS/BLUE SHIELD | Admitting: Certified Nurse Midwife

## 2017-11-03 ENCOUNTER — Telehealth: Payer: Self-pay | Admitting: *Deleted

## 2017-11-03 ENCOUNTER — Encounter: Payer: Self-pay | Admitting: Certified Nurse Midwife

## 2017-11-03 VITALS — BP 108/78 | HR 101 | Wt 173.7 lb

## 2017-11-03 DIAGNOSIS — R300 Dysuria: Secondary | ICD-10-CM | POA: Diagnosis not present

## 2017-11-03 DIAGNOSIS — N898 Other specified noninflammatory disorders of vagina: Secondary | ICD-10-CM | POA: Diagnosis not present

## 2017-11-03 LAB — POCT URINALYSIS DIPSTICK
Bilirubin, UA: NEGATIVE
Blood, UA: NEGATIVE
GLUCOSE UA: NEGATIVE
Ketones, UA: NEGATIVE
LEUKOCYTES UA: NEGATIVE
Nitrite, UA: NEGATIVE
Protein, UA: NEGATIVE
Spec Grav, UA: 1.015 (ref 1.010–1.025)
Urobilinogen, UA: 0.2 E.U./dL
pH, UA: 6.5 (ref 5.0–8.0)

## 2017-11-03 MED ORDER — FLUCONAZOLE 150 MG PO TABS: 150.0000 mg | ORAL_TABLET | Freq: Once | ORAL | 0 refills | Status: AC

## 2017-11-03 NOTE — Patient Instructions (Signed)

## 2017-11-03 NOTE — Telephone Encounter (Signed)
appt was made for pt 11/03/17

## 2017-11-03 NOTE — Telephone Encounter (Signed)
Patient states she thinks she has a yeast infection, but it also has an odor with it. Patient states she is unsure if she needs to make an appt or if she the provider can call her something in for it.  Patient is requesting a call back. Her contact # is  [phone]. Please advise. Thank you

## 2017-11-03 NOTE — Progress Notes (Signed)
GYN ENCOUNTER NOTE  Subjective:       Haley Wong is a 32 y.o. G37P2002 female is here for gynecologic evaluation of the following issues:  1. Increased vaginal discharge for the last 3-4 days with odor. She denies itching , she denies using any new products, she denies an new sexual partners. She states that she has been have more frequent intercourse. She is also having some urgency and frequency with urination. She denies fever.    Gynecologic History No LMP recorded. Patient is not currently having periods (Reason: Irregular Periods). Contraception: vasectomy   Obstetric History OB History  Gravida Para Term Preterm AB Living  2 2 2     2   SAB TAB Ectopic Multiple Live Births          2    # Outcome Date GA Lbr Len/2nd Weight Sex Delivery Anes PTL Lv  2 Term 03/04/13 [redacted]w[redacted]d 07:42 / 00:18  F Vag-Spont EPI  LIV  1 Term 2012 [redacted]w[redacted]d 13:00 7 lb (3.175 kg) M Vag-Spont EPI  LIV      Past Medical History:  Diagnosis Date  . Anemia   . Anxiety    uses albuterol prn for SOB caused by anxiety  . Asthma   . Chronic interstitial cystitis   . Dysrhythmia    tachycardia on B blockers  . GERD (gastroesophageal reflux disease)   . Headache(784.0)   . Hx of gastric ulcer   . Hx of varicella   . IBS (irritable bowel syndrome)   . Other hemoglobinopathies (HCC)   . Tachycardia    take metoprolol 25mg  daily since 33 yo  . Unspecified hemorrhoids without mention of complication     Past Surgical History:  Procedure Laterality Date  . LAPAROSCOPY  2009   exploratory surgery, diagnosed interstitial cystitis    Current Outpatient Medications on File Prior to Visit  Medication Sig Dispense Refill  . amphetamine-dextroamphetamine (ADDERALL) 10 MG tablet Take 1 tablet (10 mg total) by mouth daily with breakfast. 30 tablet 0  . amphetamine-dextroamphetamine (ADDERALL) 15 MG tablet Take 1 tablet by mouth daily. 30 tablet 0  . Cholecalciferol (VITAMIN D3) 5000 units CAPS Take 1 capsule  (5,000 Units total) by mouth daily. 120 capsule 2  . clonazePAM (KLONOPIN) 0.5 MG tablet     . FLUoxetine (PROZAC) 20 MG capsule Take 1 capsule (20 mg total) by mouth daily. 90 capsule 3  . metoprolol succinate (TOPROL-XL) 25 MG 24 hr tablet Take 1 tablet (25 mg total) by mouth at bedtime. 30 tablet 3  . rizatriptan (MAXALT) 10 MG tablet Take 1 tablet (10 mg total) by mouth as needed for migraine. May repeat in 2 hours if needed 10 tablet 2  . triazolam (HALCION) 0.125 MG tablet Take 1 tablet (0.125 mg total) by mouth at bedtime as needed for sleep. 30 tablet 2  . valACYclovir (VALTREX) 1000 MG tablet TAKE 2 TABLETS BY MOUTH TWICE DAILY    . albuterol (PROVENTIL HFA;VENTOLIN HFA) 108 (90 BASE) MCG/ACT inhaler Inhale 2 puffs into the lungs every 6 (six) hours as needed for wheezing or shortness of breath.     No current facility-administered medications on file prior to visit.     Allergies  Allergen Reactions  . Latex Rash  . Amoxicillin   . Pineapple Other (See Comments)    Throat and tongue swells    Social History   Socioeconomic History  . Marital status: Married    Spouse name: Not on file  .  Number of children: Not on file  . Years of education: Not on file  . Highest education level: Not on file  Social Needs  . Financial resource strain: Not on file  . Food insecurity - worry: Not on file  . Food insecurity - inability: Not on file  . Transportation needs - medical: Not on file  . Transportation needs - non-medical: Not on file  Occupational History  . Not on file  Tobacco Use  . Smoking status: Never Smoker  . Smokeless tobacco: Never Used  Substance and Sexual Activity  . Alcohol use: Yes    Comment: occas  . Drug use: No  . Sexual activity: Yes    Birth control/protection: None  Other Topics Concern  . Not on file  Social History Narrative  . Not on file    Family History  Problem Relation Age of Onset  . Hypothyroidism Maternal Aunt   . Diabetes  Maternal Grandmother   . Kidney disease Maternal Grandmother   . Hypothyroidism Maternal Grandmother   . Diabetes Maternal Grandfather   . Hypertension Maternal Grandfather   . Heart attack Maternal Grandfather   . Hypertension Paternal Grandfather   . Hypothyroidism Maternal Aunt   . Hypothyroidism Maternal Aunt   . Hypothyroidism Maternal Aunt     The following portions of the patient's history were reviewed and updated as appropriate: allergies, current medications, past family history, past medical history, past social history, past surgical history and problem list.  Review of Systems Review of Systems - Negative except as mentioned in HPI Review of Systems - General ROS: negative for - chills, fatigue, fever, hot flashes, malaise or night sweats Hematological and Lymphatic ROS: negative for - bleeding problems or swollen lymph nodes Gastrointestinal ROS: negative for - abdominal pain, blood in stools, change in bowel habits and nausea/vomiting Musculoskeletal ROS: negative for - joint pain, muscle pain or muscular weakness Genito-Urinary ROS: negative for - change in menstrual cycle, dysmenorrhea, dyspareunia, dysuria, genital discharge, genital ulcers, hematuria, incontinence, irregular/heavy menses, nocturia or pelvic painjj  Objective:   BP 108/78   Pulse (!) 101   Wt 173 lb 11.2 oz (78.8 kg)   BMI 30.77 kg/m  CONSTITUTIONAL: Well-developed, well-nourished female in no acute distress.  HENT:  Normocephalic, atraumatic.  NECK: Normal range of motion, supple, no masses.  Normal thyroid.  SKIN: Skin is warm and dry. No rash noted. Not diaphoretic. No erythema. No pallor. NEUROLGIC: Alert and oriented to person, place, and time.  PSYCHIATRIC: Normal mood and affect. Normal behavior. Normal judgment and thought content. CARDIOVASCULAR:Not Examined RESPIRATORY: Not Examined BREASTS: Not Examined ABDOMEN: Soft, non distended; Non tender.  No Organomegaly. PELVIC:  External  Genitalia: Normal  BUS: Normal  Vagina: Normal, white milk discharge with particulate mater, no odor noted  Cervix: Normal MUSCULOSKELETAL: Normal range of motion. No tenderness.  No cyanosis, clubbing, or edema.   Assessment:   1. Dysuria  - POCT urinalysis dipstick  2.Vaginal discharge  - nuswab   Plan:   Diflucan ordered , will follow up with results of swab. Encouraged use of boric acid as a preventative. Encouraged increase fluid and cranberry juice, over the counter azo as needed. She verbalizes understanding and agrees to plan . Will follow up with results.   Doreene BurkeAnnie Johnatha Zeidman, CNM

## 2017-11-06 ENCOUNTER — Encounter: Payer: Self-pay | Admitting: Certified Nurse Midwife

## 2017-11-06 LAB — NUSWAB BV AND CANDIDA, NAA
Candida albicans, NAA: POSITIVE — AB
Candida glabrata, NAA: NEGATIVE

## 2017-11-08 ENCOUNTER — Encounter: Payer: Self-pay | Admitting: Certified Nurse Midwife

## 2017-11-08 LAB — URINE CULTURE: Organism ID, Bacteria: NO GROWTH

## 2017-11-27 ENCOUNTER — Other Ambulatory Visit: Payer: Self-pay

## 2017-11-27 ENCOUNTER — Encounter: Payer: Self-pay | Admitting: Emergency Medicine

## 2017-11-27 ENCOUNTER — Ambulatory Visit
Admission: EM | Admit: 2017-11-27 | Discharge: 2017-11-27 | Disposition: A | Discharge status: Home / Self Care | Payer: BLUE CROSS/BLUE SHIELD | Attending: Family Medicine | Admitting: Family Medicine

## 2017-11-27 DIAGNOSIS — R059 Cough, unspecified: Secondary | ICD-10-CM

## 2017-11-27 DIAGNOSIS — J029 Acute pharyngitis, unspecified: Secondary | ICD-10-CM

## 2017-11-27 DIAGNOSIS — J0140 Acute pansinusitis, unspecified: Secondary | ICD-10-CM | POA: Diagnosis not present

## 2017-11-27 DIAGNOSIS — R05 Cough: Secondary | ICD-10-CM

## 2017-11-27 LAB — RAPID STREP SCREEN (MED CTR MEBANE ONLY): Streptococcus, Group A Screen (Direct): NEGATIVE

## 2017-11-27 MED ORDER — ALBUTEROL SULFATE HFA 108 (90 BASE) MCG/ACT IN AERS: 2.0000 | INHALATION_SPRAY | Freq: Four times a day (QID) | RESPIRATORY_TRACT | 0 refills | Status: DC | PRN

## 2017-11-27 MED ORDER — DOXYCYCLINE HYCLATE 100 MG PO CAPS: 100.0000 mg | ORAL_CAPSULE | Freq: Two times a day (BID) | ORAL | 0 refills | Status: AC

## 2017-11-27 MED ORDER — BENZONATATE 100 MG PO CAPS: 100.0000 mg | ORAL_CAPSULE | Freq: Three times a day (TID) | ORAL | 0 refills | Status: DC | PRN

## 2017-11-27 NOTE — Discharge Instructions (Addendum)
Recommend start Doxycycline 100mg  twice a day as directed. May take Tessalon cough pills 1 every 8 hours as needed. Recommend Albuterol inhaler 2 puffs every 6 hours as needed for cough or chest tightness. Continue to push fluids to help loosen up mucus. Follow-up with your PCP in 4 to 5 days if not improving.

## 2017-11-27 NOTE — ED Provider Notes (Signed)
[facility]    CSN: [id number] Arrival date & time: 11/27/17  1129     History   Chief Complaint Chief Complaint  Patient presents with  . Sore Throat  . Cough  . Nasal Congestion    HPI [place] is a 32 y.o. female.   32 year old female presents with nasal congestion, sinus pressure, sore throat, ear fullness, cough and chest tightness for over 1 week. Also having decreased appetite but no fever, nausea, or vomiting. Has taken Claritin, Sudafed, Benadryl and Honey with minimal relief. Her kids have been sick the past few weeks with various viral illnesses but no distinct influenza or strep. Does have history of anxiety, tachycardia, headaches, asthma, insomnia and ADD. Currently on Toprol, Prozac, Klonopin, Adderall, and Halcion as directed.    The history is provided by the patient.    Past Medical History:  Diagnosis Date  . Anemia   . Anxiety    uses albuterol prn for SOB caused by anxiety  . Asthma   . Chronic interstitial cystitis   . Dysrhythmia    tachycardia on B blockers  . GERD (gastroesophageal reflux disease)   . Headache(784.0)   . Hx of gastric ulcer   . Hx of varicella   . IBS (irritable bowel syndrome)   . Other hemoglobinopathies (HCC)   . Tachycardia    take metoprolol 25mg  daily since 32 yo  . Unspecified hemorrhoids without mention of complication     There are no active problems to display for this patient.   Past Surgical History:  Procedure Laterality Date  . LAPAROSCOPY  2009   exploratory surgery, diagnosed interstitial cystitis    OB History    Gravida Para Term Preterm AB Living   2 2 2     2    SAB TAB Ectopic Multiple Live Births           2       Home Medications    Prior to Admission medications   Medication Sig Start Date End Date Taking? Authorizing Provider  amphetamine-dextroamphetamine (ADDERALL) 10 MG tablet Take 1 tablet (10 mg total) by mouth daily with breakfast. 10/19/17  Yes Shambley,  Melody N, CNM  amphetamine-dextroamphetamine (ADDERALL) 15 MG tablet Take 1 tablet by mouth daily. 10/19/17  Yes Shambley, Melody N, CNM  clonazePAM (KLONOPIN) 0.5 MG tablet  08/24/16  Yes [provider]  FLUoxetine (PROZAC) 20 MG capsule Take 1 capsule (20 mg total) by mouth daily. 10/19/17  Yes Shambley, Melody N, CNM  metoprolol succinate (TOPROL-XL) 25 MG 24 hr tablet Take 1 tablet (25 mg total) by mouth at bedtime. 08/04/17  Yes Shambley, Melody N, CNM  rizatriptan (MAXALT) 10 MG tablet Take 1 tablet (10 mg total) by mouth as needed for migraine. May repeat in 2 hours if needed 09/20/17  Yes Shambley, Melody N, CNM  albuterol (PROVENTIL HFA;VENTOLIN HFA) 108 (90 Base) MCG/ACT inhaler Inhale 2 puffs into the lungs every 6 (six) hours as needed for wheezing or shortness of breath. 11/27/17   Sudie Grumbling, NP  benzonatate (TESSALON) 100 MG capsule Take 1 capsule (100 mg total) by mouth 3 (three) times daily as needed for cough. 11/27/17   Sudie Grumbling, NP  Cholecalciferol (VITAMIN D3) 5000 units CAPS Take 1 capsule (5,000 Units total) by mouth daily. 07/10/17   Shambley, Melody N, CNM  doxycycline (VIBRAMYCIN) 100 MG capsule Take 1 capsule (100 mg total) by mouth 2 (two) times daily for 7  days. 11/27/17 12/04/17  Sudie Grumbling, NP  triazolam (HALCION) 0.125 MG tablet Take 1 tablet (0.125 mg total) by mouth at bedtime as needed for sleep. 10/19/17   Shambley, Melody N, CNM  valACYclovir (VALTREX) 1000 MG tablet TAKE 2 TABLETS BY MOUTH TWICE DAILY 09/10/16   [provider]    Family History Family History  Problem Relation Age of Onset  . Hypothyroidism Maternal Aunt   . Diabetes Maternal Grandmother   . Kidney disease Maternal Grandmother   . Hypothyroidism Maternal Grandmother   . Diabetes Maternal Grandfather   . Hypertension Maternal Grandfather   . Heart attack Maternal Grandfather   . Hypertension Paternal Grandfather   . Hypothyroidism Maternal Aunt   .  Hypothyroidism Maternal Aunt   . Hypothyroidism Maternal Aunt     Social History Social History   Tobacco Use  . Smoking status: Never Smoker  . Smokeless tobacco: Never Used  Substance Use Topics  . Alcohol use: Yes    Comment: occas  . Drug use: No     Allergies   Latex; Amoxicillin; and Pineapple   Review of Systems Review of Systems  Constitutional: Positive for appetite change and fatigue. Negative for chills and fever.  HENT: Positive for congestion, ear pain (bilateral ear fullness), postnasal drip, sinus pressure, sinus pain and sore throat. Negative for ear discharge, facial swelling, mouth sores, nosebleeds and trouble swallowing.   Eyes: Negative for pain, discharge, redness and itching.  Respiratory: Positive for cough, chest tightness and wheezing. Negative for shortness of breath.   Gastrointestinal: Negative for abdominal pain, diarrhea, nausea and vomiting.  Musculoskeletal: Positive for arthralgias and myalgias. Negative for neck pain and neck stiffness.  Skin: Negative for rash and wound.  Neurological: Positive for headaches. Negative for dizziness, tremors, seizures, syncope, weakness, light-headedness and numbness.  Hematological: Negative for adenopathy. Does not bruise/bleed easily.  Psychiatric/Behavioral: Positive for sleep disturbance.     Physical Exam Triage Vital Signs ED Triage Vitals  Enc Vitals Group     BP 11/27/17 1155 114/79     Pulse Rate 11/27/17 1155 88     Resp 11/27/17 1155 16     Temp 11/27/17 1155 98.3 F (36.8 C)     Temp Source 11/27/17 1155 Oral     SpO2 11/27/17 1155 100 %     Weight 11/27/17 1153 175 lb (79.4 kg)     Height 11/27/17 1153 5\' 3"  (1.6 m)     Head Circumference --      Peak Flow --      Pain Score 11/27/17 1153 5     Pain Loc --      Pain Edu? --      Excl. in GC? --    No data found.  Updated Vital Signs BP 114/79 (BP Location: Left Arm)   Pulse 88   Temp 98.3 F (36.8 C) (Oral)   Resp 16   Ht  5\' 3"  (1.6 m)   Wt 175 lb (79.4 kg)   LMP 11/16/2017 (Approximate)   SpO2 100%   BMI 31.00 kg/m   Visual Acuity Right Eye Distance:   Left Eye Distance:   Bilateral Distance:    Right Eye Near:   Left Eye Near:    Bilateral Near:     Physical Exam  Constitutional: She is oriented to person, place, and time. She appears well-developed and well-nourished. She appears ill. No distress.  HENT:  Head: Normocephalic and atraumatic.  Right Ear: Hearing, external ear  and ear canal normal. Tympanic membrane is bulging. Tympanic membrane is not injected, not perforated and not erythematous.  Left Ear: Hearing, external ear and ear canal normal. Tympanic membrane is bulging. Tympanic membrane is not injected, not perforated and not erythematous.  Nose: Mucosal edema and rhinorrhea present. Right sinus exhibits maxillary sinus tenderness and frontal sinus tenderness. Left sinus exhibits maxillary sinus tenderness and frontal sinus tenderness.  Mouth/Throat: Uvula is midline and mucous membranes are normal. Oropharyngeal exudate (clear to yellow post nasal drainage) and posterior oropharyngeal erythema present.  Eyes: Conjunctivae and EOM are normal.  Neck: Normal range of motion. Neck supple.  Cardiovascular: Normal rate, regular rhythm and normal heart sounds.  No murmur heard. Pulmonary/Chest: Effort normal. No stridor. No respiratory distress. She has decreased breath sounds in the right upper field, the right lower field, the left upper field and the left lower field. She has wheezes in the right upper field and the left upper field. She has rhonchi (coarse breath sounds with coughing) in the right upper field and the left upper field. She has no rales.  Lymphadenopathy:    She has cervical adenopathy.       Right cervical: Superficial cervical adenopathy present.       Left cervical: Superficial cervical adenopathy present.  Neurological: She is alert and oriented to person, place, and  time.  Skin: Skin is warm and dry. Capillary refill takes less than 2 seconds. No rash noted.  Psychiatric: She has a normal mood and affect. Her behavior is normal. Judgment and thought content normal.     UC Treatments / Results  Labs (all labs ordered are listed, but only abnormal results are displayed) Labs Reviewed  RAPID STREP SCREEN (NOT AT [facility])  CULTURE, GROUP A STREP [facility])    EKG  EKG Interpretation None       Radiology No results found.  Procedures Procedures (including critical care time)  Medications Ordered in UC Medications - No data to display   Initial Impression / Assessment and Plan / UC Course  I have reviewed the triage vital signs and the nursing notes.  Pertinent labs & imaging results that were available during my care of the patient were reviewed by me and considered in my medical decision making (see chart for details).    Reviewed negative rapid strep test with patient. Discussed that she probably has a sinus infection with drainage causing the cough. Recommend start Doxycycline 100mg  twice a day as directed. May take Tessalon cough pills 1 every 8 hours as needed. Recommend Albuterol inhaler 2 puffs every 6 hours as needed for cough or chest tightness. Continue to push fluids to help loosen up mucus. Follow-up with her PCP in 4 to 5 days if not improving.    Final Clinical Impressions(s) / UC Diagnoses   Final diagnoses:  Acute non-recurrent pansinusitis  Cough  Pharyngitis, unspecified etiology    ED Discharge Orders        Ordered    doxycycline (VIBRAMYCIN) 100 MG capsule  2 times daily     11/27/17 1333    benzonatate (TESSALON) 100 MG capsule  3 times daily PRN     11/27/17 1333    albuterol (PROVENTIL HFA;VENTOLIN HFA) 108 (90 Base) MCG/ACT inhaler  Every 6 hours PRN     11/27/17 1333       Controlled Substance Prescriptions [place] Controlled Substance Registry consulted? Not Applicable   Sudie Grumblingmyot, Zandra Lajeunesse Berry, NP 11/27/17  2118

## 2017-11-27 NOTE — ED Triage Notes (Signed)
Patient c/o sore throat, sinus congestion and pressure, and cough and chest congestion for a week.

## 2017-11-30 LAB — CULTURE, GROUP A STREP (THRC)

## 2017-12-04 ENCOUNTER — Telehealth: Payer: Self-pay | Admitting: Obstetrics and Gynecology

## 2017-12-04 NOTE — Telephone Encounter (Signed)
The patient called and stated that she needs Melody or a provider to fill a few prescriptions. The patient gets shaking spells and lightheaded really often. No other information was disclosed. Please advise. The patient would like a call back.

## 2017-12-05 ENCOUNTER — Encounter: Payer: Self-pay | Admitting: Obstetrics and Gynecology

## 2017-12-05 ENCOUNTER — Other Ambulatory Visit: Payer: Self-pay | Admitting: *Deleted

## 2017-12-05 DIAGNOSIS — R002 Palpitations: Secondary | ICD-10-CM

## 2017-12-05 NOTE — Telephone Encounter (Signed)
Spoke with pt she is having some increased heart rate issues, advised pt [facility] is out of the office until 12/25/17, she is going to contact cardiology

## 2017-12-06 ENCOUNTER — Encounter: Payer: Self-pay | Admitting: Internal Medicine

## 2017-12-13 ENCOUNTER — Ambulatory Visit: Payer: BLUE CROSS/BLUE SHIELD | Admitting: Internal Medicine

## 2017-12-13 ENCOUNTER — Encounter: Payer: Self-pay | Admitting: Internal Medicine

## 2017-12-13 VITALS — BP 116/72 | HR 84 | Ht 63.0 in | Wt 172.8 lb

## 2017-12-13 DIAGNOSIS — I951 Orthostatic hypotension: Secondary | ICD-10-CM

## 2017-12-13 DIAGNOSIS — R55 Syncope and collapse: Secondary | ICD-10-CM

## 2017-12-13 DIAGNOSIS — R000 Tachycardia, unspecified: Secondary | ICD-10-CM

## 2017-12-13 DIAGNOSIS — G90A Postural orthostatic tachycardia syndrome (POTS): Secondary | ICD-10-CM

## 2017-12-13 NOTE — Patient Instructions (Addendum)
Medication Instructions:  Your physician has recommended you make the following change in your medication:   Begin Sodium Supplemements: Vytassium, Thermatabs, or Nuun  Labwork: None ordered.  Testing/Procedures: Zio Patch  Follow-Up: Your physician recommends that you schedule a follow-up appointment in:   6 weeks in [place] with Dr Graciela HusbandsKlein Follow up with your PCP for Antidepressant Management Follow up with your OB/GYN for hormonal management  We will be contacting you to schedule a nurse visit to have your Zio Patch placed and to be given a POTS book.  Any Other Special Instructions Will Be Listed Below (If Applicable).  Read the POTS Book Add table salt to your diet Begin drinking enough fluids for your urine to run clear Contact the [facility] for counciling needs    If you need a refill on your cardiac medications before your next appointment, please call your pharmacy.

## 2017-12-13 NOTE — Progress Notes (Signed)
ELECTROPHYSIOLOGY CONSULT NOTE  Patient ID: Haley Wong, MRN: [id number], DOB/AGE: [birth year] 31 y.o. Admit date: (Not on file) Date of Consult: 12/13/2017  Primary Physician: Marina GoodellFeldpausch, Dale E, MD Primary Cardiologist: AP     Haley MangesBrittany M Deharo is a 32 y.o. female who is being seen today for the evaluation of palpitations  at the request of Dr Laural GoldenShamblee.    HPI [place] is a 32 y.o. female  With a 15-year history of recurrent symptoms of syncope and presyncope, palpitations and exercise intolerance, heat intolerance and shower intolerance, exercise and non-exercise associated palpitations. Her syncope and presyncope spells are associated with a stereo typical prodrome characterized by tingling tinnitus visual loss flushing diaphoresis.  The recovery phase is similar with residual orthostatic intolerance and recovery fatigue.  She has been able to learn to abort syncope by sitting down.  Her symptoms were less in the second trimester of her 2 pregnancies (Haley Wong and Haley Wong-which she does not know how to spell) and prior to her first delivery during which time she was on birth control.  She is fluid deplete and salt deplete.  She tolerates illness poorly.  There is been diagnosis of anxiety and depression and she has been on a variety of antidepressants.  Her life is also characterized by problems with sweating.  She has hot feet and sweaty palms.  Since being started on SSRIs she has struggled with nightmares and night sweats.  She sleeps poorly.  Concentration has been a challenge.  She was started on ADD medicine which helped her concentration but has made much of her jitteriness worse.  Many years ago she was started on metoprolol which she has been on for 15 years.  She has problems with chest tightness.  This can linger for hours.  This can be aggravated by exertion and she also has problems breathing both when it is hot and when it is cold.  She has a history  of exercise-induced asthma  Other issues include interstitial cystitis irritable bowel , and headaches.   Midodrine-n Florinef-n Droxydopa-n Mestinon-n Beta blockers-y Ivabradine-n Serotonin drugs-n Anticholinergics-n      Past Medical History:  Diagnosis Date  . Anemia   . Anxiety    uses albuterol prn for SOB caused by anxiety  . Asthma   . Chronic interstitial cystitis   . Dysrhythmia    tachycardia on B blockers  . GERD (gastroesophageal reflux disease)   . Headache(784.0)   . Hx of gastric ulcer   . Hx of varicella   . IBS (irritable bowel syndrome)   . Other hemoglobinopathies (HCC)   . Tachycardia    take metoprolol 25mg  daily since 32 yo  . Unspecified hemorrhoids without mention of complication       Surgical History:  Past Surgical History:  Procedure Laterality Date  . LAPAROSCOPY  2009   exploratory surgery, diagnosed interstitial cystitis     Home Meds: Prior to Admission medications   Medication Sig Start Date End Date Taking? Authorizing Provider  albuterol (PROVENTIL HFA;VENTOLIN HFA) 108 (90 Base) MCG/ACT inhaler Inhale 2 puffs into the lungs every 6 (six) hours as needed for wheezing or shortness of breath. 11/27/17  Yes Amyot, Ali LoweAnn Berry, NP  amphetamine-dextroamphetamine (ADDERALL) 10 MG tablet Take 1 tablet (10 mg total) by mouth daily with breakfast. 10/19/17  Yes Shambley, Melody N, CNM  amphetamine-dextroamphetamine (ADDERALL) 15 MG tablet Take 1 tablet by mouth daily. 10/19/17  Yes Purcell NailsShambley, Melody N, CNM  benzonatate (TESSALON) 100 MG capsule Take 1 capsule (100 mg total) by mouth 3 (three) times daily as needed for cough. 11/27/17  Yes Amyot, Ali Lowe, NP  Cholecalciferol (VITAMIN D3) 5000 units CAPS Take 1 capsule (5,000 Units total) by mouth daily. 07/10/17  Yes Shambley, Melody N, CNM  clonazePAM (KLONOPIN) 0.5 MG tablet  08/24/16  Yes [provider]  FLUoxetine (PROZAC) 20 MG capsule Take 1 capsule (20 mg total) by mouth daily.  10/19/17  Yes Shambley, Melody N, CNM  metoprolol succinate (TOPROL-XL) 25 MG 24 hr tablet Take 1 tablet (25 mg total) by mouth at bedtime. 08/04/17  Yes Shambley, Melody N, CNM  rizatriptan (MAXALT) 10 MG tablet Take 1 tablet (10 mg total) by mouth as needed for migraine. May repeat in 2 hours if needed 09/20/17  Yes Shambley, Melody N, CNM  triazolam (HALCION) 0.125 MG tablet Take 1 tablet (0.125 mg total) by mouth at bedtime as needed for sleep. 10/19/17  Yes Shambley, Melody N, CNM  valACYclovir (VALTREX) 1000 MG tablet TAKE 2 TABLETS BY MOUTH TWICE DAILY 09/10/16  Yes [provider]    Allergies:  Allergies  Allergen Reactions  . Latex Rash  . Amoxicillin   . Pineapple Other (See Comments)    Throat and tongue swells    Social History   Socioeconomic History  . Marital status: Married    Spouse name: Not on file  . Number of children: Not on file  . Years of education: Not on file  . Highest education level: Not on file  Social Needs  . Financial resource strain: Not on file  . Food insecurity - worry: Not on file  . Food insecurity - inability: Not on file  . Transportation needs - medical: Not on file  . Transportation needs - non-medical: Not on file  Occupational History  . Not on file  Tobacco Use  . Smoking status: Never Smoker  . Smokeless tobacco: Never Used  Substance and Sexual Activity  . Alcohol use: Yes    Comment: occas  . Drug use: No  . Sexual activity: Yes    Birth control/protection: None  Other Topics Concern  . Not on file  Social History Narrative  . Not on file     Family History  Problem Relation Age of Onset  . Hypothyroidism Maternal Aunt   . Diabetes Maternal Grandmother   . Kidney disease Maternal Grandmother   . Hypothyroidism Maternal Grandmother   . Diabetes Maternal Grandfather   . Hypertension Maternal Grandfather   . Heart attack Maternal Grandfather   . Hypertension Paternal Grandfather   . Hypothyroidism Maternal  Aunt   . Hypothyroidism Maternal Aunt   . Hypothyroidism Maternal Aunt      ROS:  Please see the history of present illness.     All other systems reviewed and negative.    Physical Exam: Blood pressure 116/72, pulse 84, height 5\' 3"  (1.6 m), weight 172 lb 12.8 oz (78.4 kg), last menstrual period 11/16/2017, SpO2 98 %. General: Well developed, well nourished female in no acute distress. Head: Normocephalic, atraumatic, sclera non-icteric, no xanthomas, nares are without discharge. EENT: normal  Lymph Nodes:  none Neck: Negative for carotid bruits. JVD not elevated. Back:without scoliosis kyphosis Lungs: Clear bilaterally to auscultation without wheezes, rales, or rhonchi. Breathing is unlabored. Heart: RRR with S1 S2. No  murmur . No rubs, or gallops appreciated. Abdomen: Soft, non-tender, non-distended with normoactive bowel sounds. No hepatomegaly. No rebound/guarding. No obvious abdominal  masses. Msk:  Strength and tone appear normal for age. Extremities: No clubbing or cyanosis. No edema.  Distal pedal pulses are 2+ and equal bilaterally. Skin: Warm and Dry Neuro: Alert and oriented X 3. CN III-XII intact Grossly normal sensory and motor function . Psych:  Responds to questions appropriately with a flat and tearful affect.      Labs: Cardiac Enzymes No results for input(s): CKTOTAL, CKMB, TROPONINI in the last 72 hours. CBC Lab Results  Component Value Date   WBC 5.1 10/26/2016   HGB 13.3 10/26/2016   HCT 40.2 10/26/2016   MCV 89 10/26/2016   PLT 306 10/26/2016   PROTIME: No results for input(s): LABPROT, INR in the last 72 hours. Chemistry No results for input(s): NA, K, CL, CO2, BUN, CREATININE, CALCIUM, PROT, BILITOT, ALKPHOS, ALT, AST, GLUCOSE in the last 168 hours.  Invalid input(s): LABALBU Lipids Lab Results  Component Value Date   CHOL 175 10/26/2016   HDL 55 10/26/2016   LDLCALC 109 (H) 10/26/2016   TRIG 55 10/26/2016   BNP No results found for:  PROBNP Thyroid Function Tests: No results for input(s): TSH, T4TOTAL, T3FREE, THYROIDAB in the last 72 hours.  Invalid input(s): FREET3 Miscellaneous No results found for: DDIMER  Radiology/Studies:  No results found.  EKG: Sinus at 84 Intervals 15/07/34 Otherwise normal   Assessment and Plan:  Dysautonomia  Anxiety  IBS/IC  Sleep disturbance  Palpitations  The patient has a long-standing history of symptoms largely consistent with dysautonomia.  We discussed extensively the issues of dysautonomia, the physiology of orthstasis and positional stress.  We discussed the role of salt and water repletion, the importance of exercise, often needing to be started in the recumbent position, and the awareness of triggers and the role of ambient heat and dehydration  We discussed the importance of addressing the anxiety and the losses associated with chronic illness.  I have asked her to follow-up with GYN regarding hormonal therapy  for menstrual suppression and to follow-up with her primary care physician to address the sleep issues which may be aggravated by her SSRI therapy  Given her palpitations will use a ZIO patch although I suspect will be sinus tachycardia at  Previous medications used:  Midodrine-n Florinef-n Droxydopa-n Mestinon-n Beta blockers-y Ivabradine-n Serotonin drugs-n Anticholinergics-n          Sherryl Wong

## 2017-12-14 ENCOUNTER — Telehealth: Payer: Self-pay | Admitting: *Deleted

## 2017-12-14 DIAGNOSIS — R002 Palpitations: Secondary | ICD-10-CM

## 2017-12-14 NOTE — Telephone Encounter (Signed)
I called and spoke with the patient. She will come to our office tomorrow morning at 9:00 am to have her ZIO monitor placed.  I made her aware of our location.

## 2017-12-14 NOTE — Telephone Encounter (Signed)
-----   Message from Oretha MilchLorren Swanson, RN sent at 12/13/2017  5:45 PM EST ----- Hi Heather,  Could you help me get this pt set up with a Zio patch, POTS book, and a follow up in 6 weeks with Dr Graciela HusbandsKlein down there?   I wasn't sure how I could place the order for the patch or where to place him in his schedule up there with her being a POTS patient.   Thank you :)  Lorren

## 2017-12-15 ENCOUNTER — Encounter: Payer: Self-pay | Admitting: Certified Nurse Midwife

## 2017-12-15 ENCOUNTER — Ambulatory Visit (INDEPENDENT_AMBULATORY_CARE_PROVIDER_SITE_OTHER): Payer: BLUE CROSS/BLUE SHIELD

## 2017-12-15 ENCOUNTER — Ambulatory Visit: Payer: BLUE CROSS/BLUE SHIELD | Admitting: Certified Nurse Midwife

## 2017-12-15 VITALS — Ht 63.0 in | Wt 170.5 lb

## 2017-12-15 DIAGNOSIS — R232 Flushing: Secondary | ICD-10-CM | POA: Diagnosis not present

## 2017-12-15 DIAGNOSIS — R002 Palpitations: Secondary | ICD-10-CM | POA: Diagnosis not present

## 2017-12-15 DIAGNOSIS — F419 Anxiety disorder, unspecified: Secondary | ICD-10-CM | POA: Diagnosis not present

## 2017-12-15 DIAGNOSIS — K649 Unspecified hemorrhoids: Secondary | ICD-10-CM

## 2017-12-15 MED ORDER — HYDROCORTISONE ACETATE 25 MG RE SUPP
25.0000 mg | Freq: Two times a day (BID) | RECTAL | 0 refills | Status: DC
Start: 2017-12-15 — End: 2019-05-01

## 2017-12-15 MED ORDER — LIDOCAINE 5 % EX OINT: 1.0000 "application " | TOPICAL_OINTMENT | CUTANEOUS | 2 refills | Status: DC | PRN

## 2017-12-15 NOTE — Patient Instructions (Signed)
Hemorrhoids    Hemorrhoids are swollen veins in and around the rectum or anus. Hemorrhoids can cause pain, itching, or bleeding. Most of the time, they do not cause serious problems. They usually get better with diet changes, lifestyle changes, and other home treatments.  Follow these instructions at home:  Eating and drinking  · Eat foods that have fiber, such as whole grains, beans, nuts, fruits, and vegetables. Ask your doctor about taking products that have added fiber (fiber supplements).  · Drink enough fluid to keep your pee (urine) clear or pale yellow.  For Pain and Swelling  · Take a warm-water bath (sitz bath) for 20 minutes to ease pain. Do this 3-4 times a day.  · If directed, put ice on the painful area. It may be helpful to use ice between your warm baths.  ¨ Put ice in a plastic bag.  ¨ Place a towel between your skin and the bag.  ¨ Leave the ice on for 20 minutes, 2-3 times a day.  General instructions  · Take over-the-counter and prescription medicines only as told by your doctor.  ¨ Medicated creams and medicines that are inserted into the anus (suppositories) may be used or applied as told.  · Exercise often.  · Go to the bathroom when you have the urge to poop (to have a bowel movement). Do not wait.  · Avoid pushing too hard (straining) when you poop.  · Keep the butt area dry and clean. Use wet toilet paper or moist paper towels.  · Do not sit on the toilet for a long time.  Contact a doctor if:  · You have any of these:  ¨ Pain and swelling that do not get better with treatment or medicine.  ¨ Bleeding that will not stop.  ¨ Trouble pooping or you cannot poop.  ¨ Pain or swelling outside the area of the hemorrhoids.  This information is not intended to replace advice given to you by your health care provider. Make sure you discuss any questions you have with your health care provider.  Document Released: 07/12/2008 Document Revised: 03/10/2016 Document Reviewed: 06/17/2015  Elsevier  Interactive Patient Education © 2018 [employer]   

## 2017-12-15 NOTE — Progress Notes (Signed)
Pt has hemorrhoids for 2 months that are painful and bleeding. Using all of the otc medications for hemorrhoids. Pt has soft stool but still have hemorrhoids. Hemorrhoids bleed to point it looks like she is on her cycle.

## 2017-12-19 ENCOUNTER — Other Ambulatory Visit: Payer: Self-pay

## 2017-12-19 MED ORDER — HYDROCORTISONE 2.5 % RE CREA: 1.0000 "application " | TOPICAL_CREAM | Freq: Two times a day (BID) | RECTAL | 0 refills | Status: DC

## 2017-12-19 NOTE — Progress Notes (Signed)
GYN ENCOUNTER NOTE  Subjective:       Haley Wong is a 32 y.o. G63P2002 female here for gynecologic evaluation of the following issues:  1. Bleeding hemorrhoids-no relief with OTC methods; however, stools are soft; intermittently for the last two (2) months 2. Hot flashes-questions if symptoms are related to Prozac, requests options for changing medication 3. Anxiety  Denies difficulty breathing or respiratory distress, chest pain, abdominal pain, excessive vaginal bleeding, dysuria, and leg pain or swelling.    Gynecologic History  Patient's last menstrual period was 11/16/2017 (approximate).  Contraception: vasectomy  Last Pap: 10/2016. Results were: normal  Obstetric History  OB History  Gravida Para Term Preterm AB Living  2 2 2     2   SAB TAB Ectopic Multiple Live Births          2    # Outcome Date GA Lbr Len/2nd Weight Sex Delivery Anes PTL Lv  2 Term 03/04/13 [redacted]w[redacted]d 07:42 / 00:18  F Vag-Spont EPI  LIV  1 Term 2012 [redacted]w[redacted]d 13:00 7 lb (3.175 kg) M Vag-Spont EPI  LIV      Past Medical History:  Diagnosis Date  . Anemia   . Anxiety    uses albuterol prn for SOB caused by anxiety  . Asthma   . Chronic interstitial cystitis   . Dysrhythmia    tachycardia on B blockers  . GERD (gastroesophageal reflux disease)   . Headache(784.0)   . Hx of gastric ulcer   . Hx of varicella   . IBS (irritable bowel syndrome)   . Other hemoglobinopathies (HCC)   . Tachycardia    take metoprolol 25mg  daily since 32 yo  . Unspecified hemorrhoids without mention of complication     Past Surgical History:  Procedure Laterality Date  . LAPAROSCOPY  2009   exploratory surgery, diagnosed interstitial cystitis    Current Outpatient Medications on File Prior to Visit  Medication Sig Dispense Refill  . albuterol (PROVENTIL HFA;VENTOLIN HFA) 108 (90 Base) MCG/ACT inhaler Inhale 2 puffs into the lungs every 6 (six) hours as needed for wheezing or shortness of breath. 1 Inhaler 0  .  amphetamine-dextroamphetamine (ADDERALL) 10 MG tablet Take 1 tablet (10 mg total) by mouth daily with breakfast. (Patient taking differently: Take 10 mg by mouth 1 day or 1 dose. ) 30 tablet 0  . amphetamine-dextroamphetamine (ADDERALL) 15 MG tablet Take 1 tablet by mouth daily. (Patient taking differently: Take 15 mg by mouth daily with breakfast. ) 30 tablet 0  . Cholecalciferol (VITAMIN D3) 5000 units CAPS Take 1 capsule (5,000 Units total) by mouth daily. 120 capsule 2  . clonazePAM (KLONOPIN) 0.5 MG tablet     . FLUoxetine (PROZAC) 20 MG capsule Take 1 capsule (20 mg total) by mouth daily. 90 capsule 3  . metoprolol succinate (TOPROL-XL) 25 MG 24 hr tablet Take 1 tablet (25 mg total) by mouth at bedtime. 30 tablet 3  . rizatriptan (MAXALT) 10 MG tablet Take 1 tablet (10 mg total) by mouth as needed for migraine. May repeat in 2 hours if needed 10 tablet 2  . triazolam (HALCION) 0.125 MG tablet Take 1 tablet (0.125 mg total) by mouth at bedtime as needed for sleep. 30 tablet 2  . valACYclovir (VALTREX) 1000 MG tablet TAKE 2 TABLETS BY MOUTH TWICE DAILY     No current facility-administered medications on file prior to visit.     Allergies  Allergen Reactions  . Latex Rash  . Amoxicillin   .  Pineapple Other (See Comments)    Throat and tongue swells    Social History   Socioeconomic History  . Marital status: Married    Spouse name: Not on file  . Number of children: Not on file  . Years of education: Not on file  . Highest education level: Not on file  Social Needs  . Financial resource strain: Not on file  . Food insecurity - worry: Not on file  . Food insecurity - inability: Not on file  . Transportation needs - medical: Not on file  . Transportation needs - non-medical: Not on file  Occupational History  . Not on file  Tobacco Use  . Smoking status: Never Smoker  . Smokeless tobacco: Never Used  Substance and Sexual Activity  . Alcohol use: Yes    Comment: occas  .  Drug use: No  . Sexual activity: Yes    Birth control/protection: None  Other Topics Concern  . Not on file  Social History Narrative  . Not on file    Family History  Problem Relation Age of Onset  . Hypothyroidism Maternal Aunt   . Diabetes Maternal Grandmother   . Kidney disease Maternal Grandmother   . Hypothyroidism Maternal Grandmother   . Diabetes Maternal Grandfather   . Hypertension Maternal Grandfather   . Heart attack Maternal Grandfather   . Hypertension Paternal Grandfather   . Hypothyroidism Maternal Aunt   . Hypothyroidism Maternal Aunt   . Hypothyroidism Maternal Aunt     The following portions of the patient's history were reviewed and updated as appropriate: allergies, current medications, past family history, past medical history, past social history, past surgical history and problem list.  Review of Systems  Review of Systems - Negative except as noted above. History obtained from the patient.   Objective:   Ht 5\' 3"  (1.6 m)   Wt 170 lb 8 oz (77.3 kg)   LMP 11/16/2017 (Approximate)   BMI 30.20 kg/m   CONSTITUTIONAL: Well-developed, well-nourished female in no acute distress.   HENT:  Normocephalic, atraumatic.    SKIN: Skin is warm and dry. No rash noted. Not diaphoretic. No erythema. No pallor.  NEUROLGIC: Alert and oriented to person, place, and time.   PSYCHIATRIC: Normal mood and affect. Normal behavior. Normal judgment and thought content.   PELVIC:  External Genitalia: Normal  Vagina: Normal  Cervix: Normal  Uterus: Normal size, shape,consistency, mobile  Adnexa: Normal  RV: External non thrombosed hemorrhoid present  Depression screen PHQ 2/9 12/15/2017  Decreased Interest 1  Down, Depressed, Hopeless 0  PHQ - 2 Score 1  Altered sleeping 3  Tired, decreased energy 1  Change in appetite 1  Feeling bad or failure about yourself  0  Trouble concentrating 1  Moving slowly or fidgety/restless 0  Suicidal thoughts 0  PHQ-9 Score  7  Difficult doing work/chores Somewhat difficult   GAD 7 : Generalized Anxiety Score 12/15/2017  Nervous, Anxious, on Edge 2  Control/stop worrying 1  Worry too much - different things 1  Trouble relaxing 3  Restless 3  Easily annoyed or irritable 1  Afraid - awful might happen 0  Total GAD 7 Score 11    Assessment:   1. Bleeding hemorrhoids   2. Hot flashes   3. Anxiety     Plan:   Discussed home treatment measures.   Continue Prozac at this time. Will contact patient via MyChart to discuss other treatment options.   Rx: Anusol and lidociane jelly,  see orders.   Declines GI referral at this time.   Reviewed red flag symptoms and when to call.   RTC as needed.    Gunnar BullaJenkins Michelle Zayon Trulson, CNM [facility], [facility]

## 2017-12-20 ENCOUNTER — Ambulatory Visit: Payer: BLUE CROSS/BLUE SHIELD | Admitting: Cardiology

## 2017-12-20 ENCOUNTER — Telehealth: Payer: Self-pay | Admitting: Internal Medicine

## 2017-12-20 NOTE — Telephone Encounter (Signed)
I called the patient to follow up with her regarding a discussion we had in the office when she came in for her ZIO monitor about her thyroid levels being rechecked.  She is aware that Dr. Graciela HusbandsKlein went back in her chart and saw her last thyroid studies from 10/2016 and that he was ok with the results from that time. Per Dr. Graciela HusbandsKlein, he did not feel like any additional studies needed to be rechecked now. She is aware he will follow up with her in April and make any further decisions at that time. She is agreeable and voices understanding.

## 2018-01-09 ENCOUNTER — Encounter: Payer: Self-pay | Admitting: Certified Nurse Midwife

## 2018-01-21 ENCOUNTER — Other Ambulatory Visit: Payer: Self-pay | Admitting: Obstetrics and Gynecology

## 2018-01-30 ENCOUNTER — Encounter: Payer: Self-pay | Admitting: Internal Medicine

## 2018-01-30 ENCOUNTER — Ambulatory Visit: Payer: Managed Care, Other (non HMO) | Admitting: Internal Medicine

## 2018-01-30 VITALS — BP 100/62 | HR 96 | Ht 63.0 in | Wt 174.0 lb

## 2018-01-30 DIAGNOSIS — G901 Familial dysautonomia [Riley-Day]: Secondary | ICD-10-CM | POA: Diagnosis not present

## 2018-01-30 DIAGNOSIS — R002 Palpitations: Secondary | ICD-10-CM

## 2018-01-30 NOTE — Patient Instructions (Signed)
Medication Instructions: - Your physician recommends that you continue on your current medications as directed. Please refer to the Current Medication list given to you today.  Labwork: - none ordered  Procedures/Testing: - none ordered  Follow-Up: - Your physician recommends that you schedule a follow-up appointment in: 4 months with Dr. Klein.   Any Additional Special Instructions Will Be Listed Below (If Applicable).     If you need a refill on your cardiac medications before your next appointment, please call your pharmacy.   

## 2018-01-30 NOTE — Progress Notes (Signed)
Patient Care Team: Marina GoodellFeldpausch, Dale E, MD as PCP - General (Family Medicine)   HPI  Haley Wong is a 32 y.o. female seen in follow-up for presumed dysautonomia.  She has IBS/IC.  She has significant sleep disturbances on SSRI therapy.  Date TSH Hgb  1/18 1.21 13.3        She is modestly improved.  She is increased her sodium intake.  This is helped.  She has not been able to aggressively increase her fluid intake; her urine remains yellow.  She has not had a.  In the last 6 weeks; her GYN has suggested uterine ablation.  She discussed with her PCP alternative SSRI therapy; they could not come up with something acceptable to her and so she has stopped her SSRI is feeling better.  She did a bunch of reading on the websites and has felt much less anxious with a diagnosis and a symptom description which matches her  Records and Results Reviewed event recorder was personally reviewed and demonstrated sinus tachycardia  Past Medical History:  Diagnosis Date  . Anemia   . Anxiety    uses albuterol prn for SOB caused by anxiety  . Asthma   . Chronic interstitial cystitis   . Dysrhythmia    tachycardia on B blockers  . GERD (gastroesophageal reflux disease)   . Headache(784.0)   . Hx of gastric ulcer   . Hx of varicella   . IBS (irritable bowel syndrome)   . Other hemoglobinopathies (HCC)   . Tachycardia    take metoprolol 25mg  daily since 32 yo  . Unspecified hemorrhoids without mention of complication     Past Surgical History:  Procedure Laterality Date  . LAPAROSCOPY  2009   exploratory surgery, diagnosed interstitial cystitis    Current Meds  Medication Sig  . albuterol (PROVENTIL HFA;VENTOLIN HFA) 108 (90 Base) MCG/ACT inhaler Inhale 2 puffs into the lungs every 6 (six) hours as needed for wheezing or shortness of breath.  . amphetamine-dextroamphetamine (ADDERALL) 10 MG tablet Take 1 tablet (10 mg total) by mouth daily with breakfast. (Patient taking  differently: Take 10 mg by mouth 1 day or 1 dose. )  . amphetamine-dextroamphetamine (ADDERALL) 15 MG tablet Take 1 tablet by mouth daily. (Patient taking differently: Take 15 mg by mouth daily with breakfast. )  . Cholecalciferol (VITAMIN D3) 5000 units CAPS Take 1 capsule (5,000 Units total) by mouth daily.  . clonazePAM (KLONOPIN) 0.5 MG tablet   . hydrocortisone (ANUSOL-HC) 2.5 % rectal cream Place 1 application rectally 2 (two) times daily.  . hydrocortisone (ANUSOL-HC) 25 MG suppository Place 1 suppository (25 mg total) rectally 2 (two) times daily.  Marland Kitchen. lidocaine (XYLOCAINE) 5 % ointment Apply 1 application topically as needed.  . metoprolol succinate (TOPROL-XL) 25 MG 24 hr tablet TAKE 1 TABLET(25 MG) BY MOUTH AT BEDTIME  . rizatriptan (MAXALT) 10 MG tablet Take 1 tablet (10 mg total) by mouth as needed for migraine. May repeat in 2 hours if needed  . sodium chloride 1 g tablet Take 1 g by mouth every morning.  . triazolam (HALCION) 0.125 MG tablet TAKE 1 TABLET BY MOUTH AT BEDTIME AS NEEDED FOR SLEEP  . valACYclovir (VALTREX) 1000 MG tablet TAKE 2 TABLETS BY MOUTH TWICE DAILY    Allergies  Allergen Reactions  . Amoxicillin Anaphylaxis  . Hydrocodone-Homatropine Itching and Other (See Comments)    Caused restlessness  . Latex Rash  . Other Rash and Other (See Comments)  Avoids due to gastritis on 06/09/14 EGD  . Pineapple Other (See Comments)    Throat and tongue swells Throat and tongue swells      Review of Systems negative except from HPI and PMH  Physical Exam BP 100/62 (BP Location: Left Arm, Patient Position: Sitting, Cuff Size: Normal)   Pulse 96   Ht 5\' 3"  (1.6 m)   Wt 174 lb (78.9 kg)   BMI 30.82 kg/m  Well developed and nourished in no acute distress HENT normal Neck supple with JVP-flat Clear Regular rate and rhythm, no murmurs or gallops Abd-soft with active BS No Clubbing cyanosis edema Skin-warm and dry A & Oriented  Grossly normal sensory and motor  function    Assessment and  Plan  Dysautonomia  Anxiety  IBS/IC  Sleep disturbance  Palpitations  She is significantly improved manifested by less dizziness.  She is doing better with salt but continues to need fluid repletion.  She also is not exercising  She discussed the desire of gyn to undertake endometrial ablation for control of menses as she is averse to hormonal therapy  She has greater awareness of her symptoms and feels less out of control;  Desires a pcp who better understands dysautonomia  Sleep is better off SSRI   We spent more than 50% of our >25 min visit in face to face counseling regarding the above       Current medicines are reviewed at length with the patient today .  The patient does not  have concerns regarding medicines.

## 2018-02-23 ENCOUNTER — Ambulatory Visit (INDEPENDENT_AMBULATORY_CARE_PROVIDER_SITE_OTHER): Payer: Managed Care, Other (non HMO) | Admitting: Obstetrics and Gynecology

## 2018-02-23 ENCOUNTER — Encounter: Payer: Self-pay | Admitting: Obstetrics and Gynecology

## 2018-02-23 VITALS — BP 108/70 | HR 84 | Ht 63.0 in | Wt 171.7 lb

## 2018-02-23 DIAGNOSIS — Z79899 Other long term (current) drug therapy: Secondary | ICD-10-CM | POA: Diagnosis not present

## 2018-02-23 DIAGNOSIS — F419 Anxiety disorder, unspecified: Secondary | ICD-10-CM

## 2018-02-23 DIAGNOSIS — F988 Other specified behavioral and emotional disorders with onset usually occurring in childhood and adolescence: Secondary | ICD-10-CM | POA: Diagnosis not present

## 2018-02-23 MED ORDER — TRIAZOLAM 0.125 MG PO TABS: 0.1250 mg | ORAL_TABLET | Freq: Every evening | ORAL | 2 refills | Status: DC | PRN

## 2018-02-23 MED ORDER — AMPHETAMINE-DEXTROAMPHETAMINE 15 MG PO TABS: 15.0000 mg | ORAL_TABLET | Freq: Every day | ORAL | 0 refills | Status: DC

## 2018-02-23 NOTE — Progress Notes (Signed)
  Subjective:     Patient ID: Haley Wong, female   DOB: [birth year], 32 y.o.   MRN: [id number]  HPI Please see previous notes, here for medication follow up. Seeing Dr Caryl Comes (cardiology)and PCP for management of general care. Reviewed last notes and labs with patient and current plan of care per Dr Caryl Comes.      States anxiety is 60% better as she stopped prozac(two weeks ago) and is sleeping better.denies any bad dreams since then and burning in feet has resolved.   Is only feeling aggitated at work, and due to stressors there. States an improvement in sex drive, and doesn't really want to go on hormones that would affect that, especially as menses have not been heavy or cramping worse.  Reports last menses only lasted a day.     Does still have intermittent episodes of palpitations( again more common at work) and desires to continue metoprolol.  adderal is working well to help with focus nd desires continuing it at this time.   Still has not increased water intake, or exercise and even though she is feeling in general better, reports feeling like things are 'off'.     Review of Systems Negative except stated above in HPI    Objective:   Physical Exam A&Ox4 Well groomed female in no distress. Blood pressure 108/70, pulse 84, height '5\' 3"'$  (1.6 m), weight 171 lb 11.2 oz (77.9 kg). Thyroid normal on exam HRR Neurologically grossly normal.    Assessment:     ADD Medication management Anxiety palpitations    Plan:    3 months of adderal prescriptions written and controlled substance contract signed.  counseled at length regarding medications and their influence on anxiety, mood swings and palpitations.  Strongly encouraged increasing water intake and discussed tips on helping with that. Also encouraged exercise in the form of yoga with meditation or pilates (as stress reliever, health maintenance and increases endorphins). Also encouraged stress relieving activities that she  enjoys- reading is one she stated.  Will consider holistic physician evaluation in future if symptoms worsen of persist.  ([facility] in [place])    RTC as needed.  [place]ody [place], CNM

## 2018-03-15 ENCOUNTER — Other Ambulatory Visit: Payer: Self-pay | Admitting: Obstetrics and Gynecology

## 2018-03-29 ENCOUNTER — Other Ambulatory Visit: Payer: Self-pay

## 2018-03-29 ENCOUNTER — Encounter: Payer: Self-pay | Admitting: Obstetrics and Gynecology

## 2018-03-29 MED ORDER — VALACYCLOVIR HCL 1 G PO TABS: 2000.0000 mg | ORAL_TABLET | Freq: Two times a day (BID) | ORAL | 1 refills | Status: DC

## 2018-05-31 ENCOUNTER — Ambulatory Visit: Payer: Managed Care, Other (non HMO) | Admitting: Internal Medicine

## 2018-08-17 ENCOUNTER — Encounter: Payer: Managed Care, Other (non HMO) | Admitting: Obstetrics and Gynecology

## 2018-08-22 ENCOUNTER — Encounter: Payer: Self-pay | Admitting: Obstetrics and Gynecology

## 2018-08-22 ENCOUNTER — Ambulatory Visit (INDEPENDENT_AMBULATORY_CARE_PROVIDER_SITE_OTHER): Payer: BLUE CROSS/BLUE SHIELD | Admitting: Obstetrics and Gynecology

## 2018-08-22 VITALS — BP 108/72 | HR 73 | Ht 63.0 in | Wt 186.7 lb

## 2018-08-22 DIAGNOSIS — E559 Vitamin D deficiency, unspecified: Secondary | ICD-10-CM

## 2018-08-22 DIAGNOSIS — F988 Other specified behavioral and emotional disorders with onset usually occurring in childhood and adolescence: Secondary | ICD-10-CM

## 2018-08-22 DIAGNOSIS — F419 Anxiety disorder, unspecified: Secondary | ICD-10-CM

## 2018-08-22 DIAGNOSIS — G479 Sleep disorder, unspecified: Secondary | ICD-10-CM

## 2018-08-22 DIAGNOSIS — E669 Obesity, unspecified: Secondary | ICD-10-CM | POA: Diagnosis not present

## 2018-08-22 MED ORDER — RIZATRIPTAN BENZOATE 10 MG PO TABS: 10.0000 mg | ORAL_TABLET | ORAL | 2 refills | Status: DC | PRN

## 2018-08-22 MED ORDER — TRIAZOLAM 0.125 MG PO TABS: 0.1250 mg | ORAL_TABLET | Freq: Every evening | ORAL | 2 refills | Status: DC | PRN

## 2018-08-22 MED ORDER — AMPHETAMINE-DEXTROAMPHETAMINE 15 MG PO TABS: 15.0000 mg | ORAL_TABLET | Freq: Two times a day (BID) | ORAL | 0 refills | Status: DC

## 2018-08-22 MED ORDER — METOPROLOL SUCCINATE ER 25 MG PO TB24: ORAL_TABLET | ORAL | 2 refills | Status: DC

## 2018-08-22 MED ORDER — BUPROPION HCL ER (XL) 150 MG PO TB24: 150.0000 mg | ORAL_TABLET | Freq: Every day | ORAL | 6 refills | Status: DC

## 2018-08-22 NOTE — Progress Notes (Signed)
  Subjective:     Patient ID: Haley Wong, female   DOB: 10/24/1985, 32 y.o.   MRN: [id number]  HPI Here to discuss medications for anxiety and ADD. She stopped adderall 2 weeks ago as she ran out of medication and needs it refilled the 15mg  works fine. 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  She is also not sleeping at night, feeling very overwhelmed, has put on weight and now fells depressed. She is tearful when talking about all of this. Desires to go back on medications but to try something different than the prozac.  Review of Systems  Cardiovascular: Positive for palpitations.  Musculoskeletal: Positive for myalgias.  Psychiatric/Behavioral: Positive for agitation, dysphoric mood and sleep disturbance. The patient is nervous/anxious.   All other systems reviewed and are negative.      Objective:   Physical Exam A&Ox4 Well groomed female in mild distress Blood pressure 108/72, pulse 73, height 5\' 3"  (1.6 m), weight 186 lb 11.2 oz (84.7 kg), last menstrual period 08/02/2018.  Body mass index is 33.07 kg/m.  Psychiatric Specialty Exam: Physical Exam  Review of Systems  Cardiovascular: Positive for palpitations.  Musculoskeletal: Positive for myalgias.  Psychiatric/Behavioral: Positive for agitation, dysphoric mood and sleep disturbance. The patient is nervous/anxious.   All other systems reviewed and are negative.   Blood pressure 108/72, pulse 73, height 5\' 3"  (1.6 m), weight 186 lb 11.2 oz (84.7 kg), last menstrual period 08/02/2018.Body mass index is 33.07 kg/m.  General Appearance: Fairly Groomed  Eye Contact:  Fair  Speech:  Clear and Coherent and Normal Rate  Volume:  Normal  Mood:  Anxious and Depressed  Affect:  Appropriate, Full Range and Tearful  Thought Process:   Disorganized, Goal Directed and Descriptions of Associations: Intact  Orientation:  Full (Time, Place, and Person)  Thought Content:  Negative  Suicidal Thoughts:  No  Homicidal Thoughts:  No  Memory:  Negative  Judgement:  Fair  Insight:  Fair  Psychomotor Activity:  Restlessness  Concentration:  Concentration: Fair and Attention Span: Fair  Recall:  Good  Fund of Knowledge:  Good  Language:  Good  Akathisia:  Negative  Handed:  Left  AIMS (if indicated):     Assets:  Communication Skills Desire for Improvement Financial Resources/Insurance Housing Intimacy Leisure Time Physical Health Resilience Social Support Transportation  ADL's:  Intact  Cognition:  WNL  Sleep:         Assessment:     Anxiety ADD Obesity Vit D deficiency Sleep disturbance    Plan:     Will restart adderall 15mg  daily, and halcion at bedtime nightly for the next month.  Will do trial of wellbutrin 1due for it   >50% of 15 minute spent in counseling.

## 2018-08-23 LAB — COMPREHENSIVE METABOLIC PANEL
A/G RATIO: 1.8 (ref 1.2–2.2)
ALK PHOS: 66 IU/L (ref 39–117)
ALT: 16 IU/L (ref 0–32)
AST: 17 IU/L (ref 0–40)
Albumin: 4.3 g/dL (ref 3.5–5.5)
BILIRUBIN TOTAL: 0.2 mg/dL (ref 0.0–1.2)
BUN/Creatinine Ratio: 21 (ref 9–23)
BUN: 20 mg/dL (ref 6–20)
CHLORIDE: 102 mmol/L (ref 96–106)
CO2: 23 mmol/L (ref 20–29)
Calcium: 9.4 mg/dL (ref 8.7–10.2)
Creatinine, Ser: 0.94 mg/dL (ref 0.57–1.00)
GFR calc non Af Amer: 81 mL/min/{1.73_m2} (ref >59)
GFR, EST AFRICAN AMERICAN: 93 mL/min/{1.73_m2} (ref >59)
GLUCOSE: 81 mg/dL (ref 65–99)
Globulin, Total: 2.4 g/dL (ref 1.5–4.5)
POTASSIUM: 4.4 mmol/L (ref 3.5–5.2)
Sodium: 138 mmol/L (ref 134–144)
TOTAL PROTEIN: 6.7 g/dL (ref 6.0–8.5)

## 2018-08-23 LAB — VITAMIN D 25 HYDROXY (VIT D DEFICIENCY, FRACTURES): VIT D 25 HYDROXY: 33.5 ng/mL (ref 30.0–100.0)

## 2018-08-23 LAB — HEMOGLOBIN A1C
ESTIMATED AVERAGE GLUCOSE: 94 mg/dL
HEMOGLOBIN A1C: 4.9 % (ref 4.8–5.6)

## 2018-08-23 LAB — THYROID PANEL WITH TSH
Free Thyroxine Index: 2.2 (ref 1.2–4.9)
T3 UPTAKE RATIO: 27 % (ref 24–39)
T4, Total: 8 ug/dL (ref 4.5–12.0)
TSH: 1.11 u[IU]/mL (ref 0.450–4.500)

## 2018-09-21 ENCOUNTER — Encounter: Payer: BLUE CROSS/BLUE SHIELD | Admitting: Obstetrics and Gynecology

## 2018-09-24 ENCOUNTER — Encounter: Payer: Self-pay | Admitting: Obstetrics and Gynecology

## 2018-09-24 ENCOUNTER — Ambulatory Visit (INDEPENDENT_AMBULATORY_CARE_PROVIDER_SITE_OTHER): Payer: BLUE CROSS/BLUE SHIELD | Admitting: Obstetrics and Gynecology

## 2018-09-24 VITALS — BP 121/75 | HR 77 | Ht 63.0 in | Wt 188.6 lb

## 2018-09-24 DIAGNOSIS — Z01419 Encounter for gynecological examination (general) (routine) without abnormal findings: Secondary | ICD-10-CM

## 2018-09-24 MED ORDER — AMPHETAMINE-DEXTROAMPHETAMINE 15 MG PO TABS: 15.0000 mg | ORAL_TABLET | Freq: Two times a day (BID) | ORAL | 0 refills | Status: DC

## 2018-09-24 MED ORDER — VALACYCLOVIR HCL 1 G PO TABS: 2000.0000 mg | ORAL_TABLET | Freq: Two times a day (BID) | ORAL | 1 refills | Status: DC

## 2018-09-24 NOTE — Patient Instructions (Signed)
Preventive Care 18-39 Years, Female Preventive care refers to lifestyle choices and visits with your health care provider that can promote health and wellness. What does preventive care include?  A yearly physical exam. This is also called an annual well check.  Dental exams once or twice a year.  Routine eye exams. Ask your health care provider how often you should have your eyes checked.  Personal lifestyle choices, including: ? Daily care of your teeth and gums. ? Regular physical activity. ? Eating a healthy diet. ? Avoiding tobacco and drug use. ? Limiting alcohol use. ? Practicing safe sex. ? Taking vitamin and mineral supplements as recommended by your health care provider. What happens during an annual well check? The services and screenings done by your health care provider during your annual well check will depend on your age, overall health, lifestyle risk factors, and family history of disease. Counseling Your health care provider may ask you questions about your:  Alcohol use.  Tobacco use.  Drug use.  Emotional well-being.  Home and relationship well-being.  Sexual activity.  Eating habits.  Work and work Statistician.  Method of birth control.  Menstrual cycle.  Pregnancy history.  Screening You may have the following tests or measurements:  Height, weight, and BMI.  Diabetes screening. This is done by checking your blood sugar (glucose) after you have not eaten for a while (fasting).  Blood pressure.  Lipid and cholesterol levels. These may be checked every 5 years starting at age 38.  Skin check.  Hepatitis C blood test.  Hepatitis B blood test.  Sexually transmitted disease (STD) testing.  BRCA-related cancer screening. This may be done if you have a family history of breast, ovarian, tubal, or peritoneal cancers.  Pelvic exam and Pap test. This may be done every 3 years starting at age 38. Starting at age 30, this may be done  every 5 years if you have a Pap test in combination with an HPV test.  Discuss your test results, treatment options, and if necessary, the need for more tests with your health care provider. Vaccines Your health care provider may recommend certain vaccines, such as:  Influenza vaccine. This is recommended every year.  Tetanus, diphtheria, and acellular pertussis (Tdap, Td) vaccine. You may need a Td booster every 10 years.  Varicella vaccine. You may need this if you have not been vaccinated.  HPV vaccine. If you are 39 or younger, you may need three doses over 6 months.  Measles, mumps, and rubella (MMR) vaccine. You may need at least one dose of MMR. You may also need a second dose.  Pneumococcal 13-valent conjugate (PCV13) vaccine. You may need this if you have certain conditions and were not previously vaccinated.  Pneumococcal polysaccharide (PPSV23) vaccine. You may need one or two doses if you smoke cigarettes or if you have certain conditions.  Meningococcal vaccine. One dose is recommended if you are age 68-21 years and a first-year college student living in a residence hall, or if you have one of several medical conditions. You may also need additional booster doses.  Hepatitis A vaccine. You may need this if you have certain conditions or if you travel or work in places where you may be exposed to hepatitis A.  Hepatitis B vaccine. You may need this if you have certain conditions or if you travel or work in places where you may be exposed to hepatitis B.  Haemophilus influenzae type b (Hib) vaccine. You may need this  if you have certain risk factors.  Talk to your health care provider about which screenings and vaccines you need and how often you need them. This information is not intended to replace advice given to you by your health care provider. Make sure you discuss any questions you have with your health care provider. Document Released: 11/29/2001 Document Revised:  06/22/2016 Document Reviewed: 08/04/2015 Elsevier Interactive Patient Education  2018 [employer]  

## 2018-09-24 NOTE — Progress Notes (Signed)
Subjective:   Yolanda MangesBrittany M Randazzo is a 32 y.o. 502P2002 Caucasian female here for a routine well-woman exam.  No LMP recorded. (Menstrual status: Irregular Periods).    Current complaints: feels 75% better since starting wellbutrin and getting back on adderal, states work is better, does have concern regarding weight and lack of loss, desires restarting weight loss program in near future if it doesn't improve. PCP: Feldpausch       doesn't desire labs  Social History: Sexual: heterosexual Marital Status: married Living situation: with family Occupation: Firefighterloan officer Tobacco/alcohol: no tobacco use Illicit drugs: no history of illicit drug use  The following portions of the patient's history were reviewed and updated as appropriate: allergies, current medications, past family history, past medical history, past social history, past surgical history and problem list.  Past Medical History Past Medical History:  Diagnosis Date  . Anemia   . Anxiety    uses albuterol prn for SOB caused by anxiety  . Asthma   . Chronic interstitial cystitis   . Dysrhythmia    tachycardia on B blockers  . GERD (gastroesophageal reflux disease)   . Headache(784.0)   . Hx of gastric ulcer   . Hx of varicella   . IBS (irritable bowel syndrome)   . Other hemoglobinopathies (HCC)   . Tachycardia    take metoprolol 25mg  daily since 32 yo  . Unspecified hemorrhoids without mention of complication     Past Surgical History Past Surgical History:  Procedure Laterality Date  . LAPAROSCOPY  2009   exploratory surgery, diagnosed interstitial cystitis    Gynecologic History G2P2002  No LMP recorded. (Menstrual status: Irregular Periods). Contraception: vasectomy Last Pap: 10/2016. Results were: normal   Obstetric History OB History  Gravida Para Term Preterm AB Living  2 2 2     2   SAB TAB Ectopic Multiple Live Births          2    # Outcome Date GA Lbr Len/2nd Weight Sex Delivery Anes PTL Lv  2  Term 03/04/13 6053w2d 07:42 / 00:18  F Vag-Spont EPI  LIV  1 Term 2012 3429w0d 13:00 7 lb (3.175 kg) M Vag-Spont EPI  LIV    Current Medications Current Outpatient Medications on File Prior to Visit  Medication Sig Dispense Refill  . amphetamine-dextroamphetamine (ADDERALL) 15 MG tablet Take 1 tablet by mouth 2 (two) times daily. 30 tablet 0  . buPROPion (WELLBUTRIN XL) 150 MG 24 hr tablet Take 1 tablet (150 mg total) by mouth daily. 30 tablet 6  . metoprolol succinate (TOPROL-XL) 25 MG 24 hr tablet TAKE 1 TABLET(25 MG) BY MOUTH AT BEDTIME 30 tablet 2  . rizatriptan (MAXALT) 10 MG tablet Take 1 tablet (10 mg total) by mouth as needed for migraine. May repeat in 2 hours if needed 10 tablet 2  . sodium chloride 1 g tablet Take 1 g by mouth every morning.    . triazolam (HALCION) 0.125 MG tablet Take 1 tablet (0.125 mg total) by mouth at bedtime as needed. for sleep 30 tablet 2  . valACYclovir (VALTREX) 1000 MG tablet Take 2 tablets (2,000 mg total) by mouth 2 (two) times daily. 20 tablet 1  . albuterol (PROVENTIL HFA;VENTOLIN HFA) 108 (90 Base) MCG/ACT inhaler Inhale 2 puffs into the lungs every 6 (six) hours as needed for wheezing or shortness of breath. (Patient not taking: Reported on 08/22/2018) 1 Inhaler 0  . amphetamine-dextroamphetamine (ADDERALL) 10 MG tablet Take 1 tablet (10 mg total) by mouth  daily with breakfast. (Patient not taking: Reported on 02/23/2018) 30 tablet 0  . Cholecalciferol (VITAMIN D3) 5000 units CAPS Take 1 capsule (5,000 Units total) by mouth daily. (Patient not taking: Reported on 08/22/2018) 120 capsule 2  . clonazePAM (KLONOPIN) 0.5 MG tablet     . hydrocortisone (ANUSOL-HC) 2.5 % rectal cream Place 1 application rectally 2 (two) times daily. (Patient not taking: Reported on 02/23/2018) 30 g 0  . hydrocortisone (ANUSOL-HC) 25 MG suppository Place 1 suppository (25 mg total) rectally 2 (two) times daily. (Patient not taking: Reported on 02/23/2018) 12 suppository 0  .  lidocaine (XYLOCAINE) 5 % ointment Apply 1 application topically as needed. (Patient not taking: Reported on 02/23/2018) 35.44 g 2   No current facility-administered medications on file prior to visit.     Review of Systems Patient denies any headaches, blurred vision, shortness of breath, chest pain, abdominal pain, problems with bowel movements, urination, or intercourse.  Objective:  BP 121/75   Pulse 77   Ht 5\' 3"  (1.6 m)   Wt 188 lb 9.6 oz (85.5 kg)   BMI 33.41 kg/m  Physical Exam  General:  Well developed, well nourished, no acute distress. She is alert and oriented x3. Skin:  Warm and dry Neck:  Midline trachea, no thyromegaly or nodules Cardiovascular: Regular rate and rhythm, no murmur heard Lungs:  Effort normal, all lung fields clear to auscultation bilaterally Breasts:  No dominant palpable mass, retraction, or nipple discharge Abdomen:  Soft, non tender, no hepatosplenomegaly or masses Pelvic:  External genitalia is normal in appearance.  The vagina is normal in appearance. The cervix is bulbous, no CMT.  Thin prep pap is not done . Uterus is felt to be normal size, shape, and contour.  No adnexal masses or tenderness noted. Extremities:  No swelling or varicosities noted Psych:  She has a normal mood and affect  Assessment:   Healthy well-woman exam Anxiety ADD obesity   Plan:  Feels a lot better since starting wellbutrin and adderall. Will continue, refilled valtrex  F/U 1 year for AE, or sooner if needed   Anjolina Byrer Suzan Nailer, CNM

## 2018-11-06 ENCOUNTER — Other Ambulatory Visit: Payer: Self-pay | Admitting: Obstetrics and Gynecology

## 2018-11-06 MED ORDER — TRIAZOLAM 0.25 MG PO TABS: 0.2500 mg | ORAL_TABLET | Freq: Every evening | ORAL | 2 refills | Status: DC | PRN

## 2019-03-21 ENCOUNTER — Other Ambulatory Visit: Payer: Self-pay | Admitting: Obstetrics and Gynecology

## 2019-03-29 DIAGNOSIS — M9902 Segmental and somatic dysfunction of thoracic region: Secondary | ICD-10-CM | POA: Diagnosis not present

## 2019-03-29 DIAGNOSIS — M5414 Radiculopathy, thoracic region: Secondary | ICD-10-CM | POA: Diagnosis not present

## 2019-03-29 DIAGNOSIS — M9901 Segmental and somatic dysfunction of cervical region: Secondary | ICD-10-CM | POA: Diagnosis not present

## 2019-03-29 DIAGNOSIS — R51 Headache: Secondary | ICD-10-CM | POA: Diagnosis not present

## 2019-04-01 DIAGNOSIS — M5414 Radiculopathy, thoracic region: Secondary | ICD-10-CM | POA: Diagnosis not present

## 2019-04-01 DIAGNOSIS — M9902 Segmental and somatic dysfunction of thoracic region: Secondary | ICD-10-CM | POA: Diagnosis not present

## 2019-04-01 DIAGNOSIS — M9901 Segmental and somatic dysfunction of cervical region: Secondary | ICD-10-CM | POA: Diagnosis not present

## 2019-04-01 DIAGNOSIS — R51 Headache: Secondary | ICD-10-CM | POA: Diagnosis not present

## 2019-04-02 DIAGNOSIS — R51 Headache: Secondary | ICD-10-CM | POA: Diagnosis not present

## 2019-04-02 DIAGNOSIS — M5414 Radiculopathy, thoracic region: Secondary | ICD-10-CM | POA: Diagnosis not present

## 2019-04-02 DIAGNOSIS — M9901 Segmental and somatic dysfunction of cervical region: Secondary | ICD-10-CM | POA: Diagnosis not present

## 2019-04-02 DIAGNOSIS — M9902 Segmental and somatic dysfunction of thoracic region: Secondary | ICD-10-CM | POA: Diagnosis not present

## 2019-04-03 DIAGNOSIS — R51 Headache: Secondary | ICD-10-CM | POA: Diagnosis not present

## 2019-04-03 DIAGNOSIS — M9902 Segmental and somatic dysfunction of thoracic region: Secondary | ICD-10-CM | POA: Diagnosis not present

## 2019-04-03 DIAGNOSIS — M5414 Radiculopathy, thoracic region: Secondary | ICD-10-CM | POA: Diagnosis not present

## 2019-04-03 DIAGNOSIS — M9901 Segmental and somatic dysfunction of cervical region: Secondary | ICD-10-CM | POA: Diagnosis not present

## 2019-04-05 DIAGNOSIS — M9901 Segmental and somatic dysfunction of cervical region: Secondary | ICD-10-CM | POA: Diagnosis not present

## 2019-04-05 DIAGNOSIS — R51 Headache: Secondary | ICD-10-CM | POA: Diagnosis not present

## 2019-04-05 DIAGNOSIS — M5414 Radiculopathy, thoracic region: Secondary | ICD-10-CM | POA: Diagnosis not present

## 2019-04-05 DIAGNOSIS — M9902 Segmental and somatic dysfunction of thoracic region: Secondary | ICD-10-CM | POA: Diagnosis not present

## 2019-05-01 ENCOUNTER — Ambulatory Visit (INDEPENDENT_AMBULATORY_CARE_PROVIDER_SITE_OTHER): Payer: BC Managed Care – PPO | Admitting: Certified Nurse Midwife

## 2019-05-01 ENCOUNTER — Other Ambulatory Visit: Payer: Self-pay

## 2019-05-01 ENCOUNTER — Encounter: Payer: Self-pay | Admitting: Certified Nurse Midwife

## 2019-05-01 ENCOUNTER — Other Ambulatory Visit (HOSPITAL_COMMUNITY)
Admission: RE | Admit: 2019-05-01 | Discharge: 2019-05-01 | Disposition: A | Discharge status: Home / Self Care | Payer: BC Managed Care – PPO | Source: Ambulatory Visit | Attending: Certified Nurse Midwife | Admitting: Certified Nurse Midwife

## 2019-05-01 VITALS — BP 97/65 | HR 80 | Ht 63.0 in | Wt 202.2 lb

## 2019-05-01 DIAGNOSIS — N898 Other specified noninflammatory disorders of vagina: Secondary | ICD-10-CM

## 2019-05-01 DIAGNOSIS — N3946 Mixed incontinence: Secondary | ICD-10-CM

## 2019-05-01 NOTE — Patient Instructions (Signed)
Vaginitis Vaginitis is a condition in which the vaginal tissue swells and becomes red (inflamed). This condition is most often caused by a change in the normal balance of bacteria and yeast that live in the vagina. This change causes an overgrowth of certain bacteria or yeast, which causes the inflammation. There are different types of vaginitis, but the most common types are:  Bacterial vaginosis.  Yeast infection (candidiasis).  Trichomoniasis vaginitis. This is a sexually transmitted disease (STD).  Viral vaginitis.  Atrophic vaginitis.  Allergic vaginitis. What are the causes? The cause of this condition depends on the type of vaginitis. It can be caused by:  Bacteria (bacterial vaginosis).  Yeast, which is a fungus (yeast infection).  A parasite (trichomoniasis vaginitis).  A virus (viral vaginitis).  Low hormone levels (atrophic vaginitis). Low hormone levels can occur during pregnancy, breastfeeding, or after menopause.  Irritants, such as bubble baths, scented tampons, and feminine sprays (allergic vaginitis). Other factors can change the normal balance of the yeast and bacteria that live in the vagina. These include:  Antibiotic medicines.  Poor hygiene.  Diaphragms, vaginal sponges, spermicides, birth control pills, and intrauterine devices (IUD).  Sex.  Infection.  Uncontrolled diabetes.  A weakened defense (immune) system. What increases the risk? This condition is more likely to develop in women who:  Smoke.  Use vaginal douches, scented tampons, or scented sanitary pads.  Wear tight-fitting pants.  Wear thong underwear.  Use oral birth control pills or an IUD.  Have sex without a condom.  Have multiple sex partners.  Have an STD.  Frequently use the spermicide nonoxynol-9.  Eat lots of foods high in sugar.  Have uncontrolled diabetes.  Have low estrogen levels.  Have a weakened immune system from an immune disorder or medical  treatment.  Are pregnant or breastfeeding. What are the signs or symptoms? Symptoms vary depending on the cause of the vaginitis. Common symptoms include:  Abnormal vaginal discharge. ? The discharge is white, gray, or yellow with bacterial vaginosis. ? The discharge is thick, white, and cheesy with a yeast infection. ? The discharge is frothy and yellow or greenish with trichomoniasis.  A bad vaginal smell. The smell is fishy with bacterial vaginosis.  Vaginal itching, pain, or swelling.  Sex that is painful.  Pain or burning when urinating. Sometimes there are no symptoms. How is this diagnosed? This condition is diagnosed based on your symptoms and medical history. A physical exam, including a pelvic exam, will also be done. You may also have other tests, including:  Tests to determine the pH level (acidity or alkalinity) of your vagina.  A whiff test, to assess the odor that results when a sample of your vaginal discharge is mixed with a potassium hydroxide solution.  Tests of vaginal fluid. A sample will be examined under a microscope. How is this treated? Treatment varies depending on the type of vaginitis you have. Your treatment may include:  Antibiotic creams or pills to treat bacterial vaginosis and trichomoniasis.  Antifungal medicines, such as vaginal creams or suppositories, to treat a yeast infection.  Medicine to ease discomfort if you have viral vaginitis. Your sexual partner should also be treated.  Estrogen delivered in a cream, pill, suppository, or vaginal ring to treat atrophic vaginitis. If vaginal dryness occurs, lubricants and moisturizing creams may help. You may need to avoid scented soaps, sprays, or douches.  Stopping use of a product that is causing allergic vaginitis. Then using a vaginal cream to treat the symptoms. Follow   these instructions at home: Lifestyle  Keep your genital area clean and dry. Avoid soap, and only rinse the area with  water.  Do not douche or use tampons until your health care provider says it is okay to do so. Use sanitary pads, if needed.  Do not have sex until your health care provider approves. When you can return to sex, practice safe sex and use condoms.  Wipe from front to back. This avoids the spread of bacteria from the rectum to the vagina. General instructions  Take over-the-counter and prescription medicines only as told by your health care provider.  If you were prescribed an antibiotic medicine, take or use it as told by your health care provider. Do not stop taking or using the antibiotic even if you start to feel better.  Keep all follow-up visits as told by your health care provider. This is important. How is this prevented?  Use mild, non-scented products. Do not use things that can irritate the vagina, such as fabric softeners. Avoid the following products if they are scented: ? Feminine sprays. ? Detergents. ? Tampons. ? Feminine hygiene products. ? Soaps or bubble baths.  Let air reach your genital area. ? Wear cotton underwear to reduce moisture buildup. ? Avoid wearing underwear while you sleep. ? Avoid wearing tight pants and underwear or nylons without a cotton panel. ? Avoid wearing thong underwear.  Take off any wet clothing, such as bathing suits, as soon as possible.  Practice safe sex and use condoms. Contact a health care provider if:  You have abdominal pain.  You have a fever.  You have symptoms that last for more than 2-3 days. Get help right away if:  You have a fever and your symptoms suddenly get worse. Summary  Vaginitis is a condition in which the vaginal tissue becomes inflamed.This condition is most often caused by a change in the normal balance of bacteria and yeast that live in the vagina.  Treatment varies depending on the type of vaginitis you have.  Do not douche, use tampons , or have sex until your health care provider approves. When  you can return to sex, practice safe sex and use condoms. This information is not intended to replace advice given to you by your health care provider. Make sure you discuss any questions you have with your health care provider. Document Released: 07/31/2007 Document Revised: 09/15/2017 Document Reviewed: 11/08/2016 Elsevier Patient Education  2020 [employer]  

## 2019-05-01 NOTE — Progress Notes (Signed)
GYN ENCOUNTER NOTE  Subjective:       Haley Wong is a 33 y.o. 212P2002 female is here for gynecologic evaluation of the following issues:  1. Vaginal discharge with odor and burning. Urinary incontenece x past year.  She is not sure if the odor is coming from the urinary incontinence or from vaginal infections. She state she has urge and stress incontinence. She sometimes is just sitting at her desk when it comes happens.    Has history of interstitial cystitis  Gynecologic History No LMP recorded. (Menstrual status: Irregular Periods). Contraception: none Last Pap: 10/25/16. Results were: normal Last mammogram: n/a.   Obstetric History OB History  Gravida Para Term Preterm AB Living  2 2 2     2   SAB TAB Ectopic Multiple Live Births          2    # Outcome Date GA Lbr Len/2nd Weight Sex Delivery Anes PTL Lv  2 Term 03/04/13 5233w2d 07:42 / 00:18  F Vag-Spont EPI  LIV  1 Term 2012 7981w0d 13:00 7 lb (3.175 kg) M Vag-Spont EPI  LIV    Past Medical History:  Diagnosis Date  . Anemia   . Anxiety    uses albuterol prn for SOB caused by anxiety  . Asthma   . Chronic interstitial cystitis   . Dysrhythmia    tachycardia on B blockers  . GERD (gastroesophageal reflux disease)   . Headache(784.0)   . Hx of gastric ulcer   . Hx of varicella   . IBS (irritable bowel syndrome)   . Other hemoglobinopathies (HCC)   . Tachycardia    take metoprolol 25mg  daily since 33 yo  . Unspecified hemorrhoids without mention of complication     Past Surgical History:  Procedure Laterality Date  . LAPAROSCOPY  2009   exploratory surgery, diagnosed interstitial cystitis    Current Outpatient Medications on File Prior to Visit  Medication Sig Dispense Refill  . albuterol (PROVENTIL HFA;VENTOLIN HFA) 108 (90 Base) MCG/ACT inhaler Inhale 2 puffs into the lungs every 6 (six) hours as needed for wheezing or shortness of breath. (Patient not taking: Reported on 08/22/2018) 1 Inhaler 0  .  amphetamine-dextroamphetamine (ADDERALL) 10 MG tablet Take 1 tablet (10 mg total) by mouth daily with breakfast. (Patient not taking: Reported on 02/23/2018) 30 tablet 0  . amphetamine-dextroamphetamine (ADDERALL) 15 MG tablet Take 1 tablet by mouth 2 (two) times daily. 60 tablet 0  . buPROPion (WELLBUTRIN XL) 150 MG 24 hr tablet Take 1 tablet (150 mg total) by mouth daily. 30 tablet 6  . Cholecalciferol (VITAMIN D3) 5000 units CAPS Take 1 capsule (5,000 Units total) by mouth daily. (Patient not taking: Reported on 08/22/2018) 120 capsule 2  . clonazePAM (KLONOPIN) 0.5 MG tablet     . hydrocortisone (ANUSOL-HC) 2.5 % rectal cream Place 1 application rectally 2 (two) times daily. (Patient not taking: Reported on 02/23/2018) 30 g 0  . hydrocortisone (ANUSOL-HC) 25 MG suppository Place 1 suppository (25 mg total) rectally 2 (two) times daily. (Patient not taking: Reported on 02/23/2018) 12 suppository 0  . lidocaine (XYLOCAINE) 5 % ointment Apply 1 application topically as needed. (Patient not taking: Reported on 02/23/2018) 35.44 g 2  . metoprolol succinate (TOPROL-XL) 25 MG 24 hr tablet TAKE 1 TABLET(25 MG) BY MOUTH AT BEDTIME 30 tablet 2  . rizatriptan (MAXALT) 10 MG tablet Take 1 tablet (10 mg total) by mouth as needed for migraine. May repeat in 2 hours if needed  10 tablet 2  . sodium chloride 1 g tablet Take 1 g by mouth every morning.    . triazolam (HALCION) 0.25 MG tablet Take 1 tablet (0.25 mg total) by mouth at bedtime as needed. for sleep 30 tablet 2  . valACYclovir (VALTREX) 1000 MG tablet TAKE 2 TABLETS(2000 MG) BY MOUTH TWICE DAILY 20 tablet 1   No current facility-administered medications on file prior to visit.     Allergies  Allergen Reactions  . Amoxicillin Anaphylaxis  . Hydrocodone-Homatropine Itching and Other (See Comments)    Caused restlessness  . Latex Rash  . Other Rash and Other (See Comments)    Avoids due to gastritis on 06/09/14 EGD  . Pineapple Other (See Comments)     Throat and tongue swells Throat and tongue swells    Social History   Socioeconomic History  . Marital status: Married    Spouse name: Not on file  . Number of children: Not on file  . Years of education: Not on file  . Highest education level: Not on file  Occupational History  . Not on file  Social Needs  . Financial resource strain: Not on file  . Food insecurity    Worry: Not on file    Inability: Not on file  . Transportation needs    Medical: Not on file    Non-medical: Not on file  Tobacco Use  . Smoking status: Never Smoker  . Smokeless tobacco: Never Used  Substance and Sexual Activity  . Alcohol use: Yes    Comment: occas  . Drug use: No  . Sexual activity: Yes    Birth control/protection: None  Lifestyle  . Physical activity    Days per week: Not on file    Minutes per session: Not on file  . Stress: Not on file  Relationships  . Social Musicianconnections    Talks on phone: Not on file    Gets together: Not on file    Attends religious service: Not on file    Active member of club or organization: Not on file    Attends meetings of clubs or organizations: Not on file    Relationship status: Not on file  . Intimate partner violence    Fear of current or ex partner: Not on file    Emotionally abused: Not on file    Physically abused: Not on file    Forced sexual activity: Not on file  Other Topics Concern  . Not on file  Social History Narrative  . Not on file    Family History  Problem Relation Age of Onset  . Hypothyroidism Maternal Aunt   . Diabetes Maternal Grandmother   . Kidney disease Maternal Grandmother   . Hypothyroidism Maternal Grandmother   . Diabetes Maternal Grandfather   . Hypertension Maternal Grandfather   . Heart attack Maternal Grandfather   . Hypertension Paternal Grandfather   . Hypothyroidism Maternal Aunt   . Hypothyroidism Maternal Aunt   . Hypothyroidism Maternal Aunt     The following portions of the patient's history  were reviewed and updated as appropriate: allergies, current medications, past family history, past medical history, past social history, past surgical history and problem list.  Review of Systems Review of Systems - Negative except as mentioned in HPI Review of Systems - General ROS: negative for - chills, fatigue, fever, hot flashes, malaise or night sweats Hematological and Lymphatic ROS: negative for - bleeding problems or swollen lymph nodes Gastrointestinal ROS: negative  for - abdominal pain, blood in stools, change in bowel habits and nausea/vomiting Musculoskeletal ROS: negative for - joint pain, muscle pain or muscular weakness Genito-Urinary ROS: negative for - change in menstrual cycle, dysmenorrhea, dyspareunia, dysuria, genital, genital ulcers, hematuria, incontinence, irregular/heavy menses, nocturia or pelvic pain. Positive for vaginal odor and incontinence   Objective:   There were no vitals taken for this visit. CONSTITUTIONAL: Well-developed, well-nourished female in no acute distress.  HENT:  Normocephalic, atraumatic.  NECK: Normal range of motion, supple, no masses.  Normal thyroid.  SKIN: Skin is warm and dry. No rash noted. Not diaphoretic. No erythema. No pallor. [place]: Alert and oriented to person, place, and time. PSYCHIATRIC: Normal mood and affect. Normal behavior. Normal judgment and thought content. CARDIOVASCULAR:Not Examined RESPIRATORY: Not Examined BREASTS: Not Examined ABDOMEN: Soft, non distended; Non tender.  No Organomegaly. PELVIC:  External Genitalia: Normal  BUS: Normal  Vagina: Normal  Cervix: Normal, white discharge no odor  Bladder: Nontender, normal  MUSCULOSKELETAL: Normal range of motion. No tenderness.  No cyanosis, clubbing, or edema.   Assessment:   Vaginal odor Urinary Incontinence    Plan:   Vaginal swab collected, urine culture sent. Discuss pelvic floor PT to help strengthen muscles and referral to urology. Will follow  up with results. Return for annual exam or PRN.   Philip Aspen, CNM

## 2019-05-02 LAB — CERVICOVAGINAL ANCILLARY ONLY
Bacterial vaginitis: POSITIVE — AB
Candida vaginitis: POSITIVE — AB

## 2019-05-03 ENCOUNTER — Other Ambulatory Visit: Payer: Self-pay | Admitting: Certified Nurse Midwife

## 2019-05-03 MED ORDER — FLUCONAZOLE 150 MG PO TABS: 150.0000 mg | ORAL_TABLET | Freq: Once | ORAL | 1 refills | Status: AC

## 2019-05-03 MED ORDER — METRONIDAZOLE 500 MG PO TABS: 500.0000 mg | ORAL_TABLET | Freq: Two times a day (BID) | ORAL | 0 refills | Status: AC

## 2019-05-03 NOTE — Progress Notes (Signed)
PT vaginal swab positive for BV and yeast. Orders placed for treatment.   Philip Aspen, CNM

## 2019-05-14 ENCOUNTER — Ambulatory Visit: Payer: BC Managed Care – PPO | Attending: Certified Nurse Midwife

## 2019-05-14 ENCOUNTER — Other Ambulatory Visit: Payer: Self-pay

## 2019-05-14 DIAGNOSIS — G8929 Other chronic pain: Secondary | ICD-10-CM | POA: Insufficient documentation

## 2019-05-14 DIAGNOSIS — M545 Low back pain: Secondary | ICD-10-CM | POA: Diagnosis not present

## 2019-05-14 DIAGNOSIS — M62838 Other muscle spasm: Secondary | ICD-10-CM | POA: Diagnosis not present

## 2019-05-14 DIAGNOSIS — R293 Abnormal posture: Secondary | ICD-10-CM | POA: Insufficient documentation

## 2019-05-14 NOTE — Patient Instructions (Signed)
Stabilization: Diaphragmatic Breathing    Lie with knees bent, feet flat. Place one hand on stomach, other on chest. Breathe deeply through nose, lifting belly hand without any motion of hand on chest.  Do this for ~ 5 min. Per night before you go to sleep and as much as you can throughout the day do a self-check to see how you are breathing.

## 2019-05-14 NOTE — Therapy (Signed)
[place] MAIN [facility] SERVICES [address] [place], [place], [address] Phone: [phone]   Fax:  [phone]  Physical Therapy Evaluation  The patient has been informed of current processes in place at Outpatient Rehab to protect patients from Covid-19 exposure including social distancing, schedule modifications, and new cleaning procedures. After discussing their particular risk with a therapist based on the patient's personal risk factors, the patient has decided to proceed with in-person therapy.   Patient Details  Name: JERICHA BRYDEN MRN: [id number] Date of Birth: 12/22/1985 Referring Provider (PT): Philip Aspen   Encounter Date: 05/14/2019  PT End of Session - 05/14/19 1009    Visit Number  1    Number of Visits  10    Date for PT Re-Evaluation  07/23/19    Authorization Type  BCBS    Authorization - Visit Number  1    Authorization - Number of Visits  10    PT Start Time  1004    PT Stop Time  1100    PT Time Calculation (min)  56 min    Activity Tolerance  Patient tolerated treatment well;No increased pain    Behavior During Therapy  WFL for tasks assessed/performed       Past Medical History:  Diagnosis Date  . Anemia   . Anxiety    uses albuterol prn for SOB caused by anxiety  . Asthma   . Chronic interstitial cystitis   . Dysrhythmia    tachycardia on B blockers  . GERD (gastroesophageal reflux disease)   . Headache(784.0)   . Hx of gastric ulcer   . Hx of varicella   . IBS (irritable bowel syndrome)   . 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 Tachycardia    take metoprolol 25mg  daily since 33 yo  . Unspecified hemorrhoids without mention of complication     Past Surgical History:  Procedure Laterality Date  . LAPAROSCOPY  2009   exploratory surgery, diagnosed interstitial cystitis    There were no vitals filed for this visit.    Pelvic Floor Physical Therapy Evaluation and  Assessment  SCREENING  Falls in last 6 mo: no    Patient's communication preference:   Red Flags:  Have you had any night sweats? no Unexplained weight loss? no Saddle anesthesia?  no Unexplained changes in bowel or bladder habits? no  SUBJECTIVE  Patient reports: Has had interstitial cystitis from a young age. Has frequency and SUI, It had decreased when she had her children but her youngest is 32 and it seems to be getting worse. Feels like she is leaking without urge now due to smell. Goes to the chiropractor frequently feels like the "bottom three" need to be stretched out and her "right hip pops out of joint". Is very self-conscious.   Precautions:  none  Social/Family/Vocational History:   Full time mortgage lender, stressful  Recent Procedures/Tests/Findings:  Had a laparoscopy in 2009, found the IC  Obstetrical History: 2 vaginal deliveries, some tearing with the first.  Gynecological History: none  Urinary History: Has IC Wearing a panty liner, knows she has leakage with cough, sneeze, etc as well as without sensation.   Gastrointestinal History: Daily BM's "either super loose or straining" strains even with type 4, IBS, Hemorrhoids  1 cup of coffee in the morning, water throughout the day, soft-drinks on special occasion. 3-4 12 ox bottles of water per day.  Sexual activity/pain: Yes, hard time staying lubricated, more with deeper thrusting.  Semen irritates her and she has burning following.   Location of pain: LBP Current pain:  2/10  Max pain:  7/10 Least pain:  1/10 Nature of pain: dull ache  Patient Goals: Decrease bladder leakage   OBJECTIVE  Posture/Observations:  Sitting:  Standing: L hip high, L ASIS high, L PSIS high, hyperlordotic.  Palpation/Segmental Motion/Joint Play: Decreased sacral and thoracic mobility with pain TTP to all musculature surrounding the pelvis with greater TTP to L OI.   Special tests:   Scoliosis:  none Leg-length: L : 87.5, R 86.5  Range of Motion/Flexibilty:  Spine: R SB to knee, L SB slightly past the knee, B rot ~ 25% reduced with pain in the LB. Hips:   Strength/MMT: Deferred to follow-up LE MMT  LE MMT Left Right  Hip flex:  (L2) /5 /5  Hip ext: /5 /5  Hip abd: /5 /5  Hip add: /5 /5  Hip IR /5 /5  Hip ER /5 /5     Abdominal:  Palpation: TTP to B Psoas, and adductors Diastasis: present, per Pt. report  Pelvic Floor External Exam: Deferred to follow-up Introitus Appears:  Skin integrity:  Palpation: Cough: Prolapse visible?: Scar mobility:  Internal Vaginal Exam: Strength (PERF):  Symmetry: Palpation: Prolapse:   Internal Rectal Exam: Strength (PERF): Symmetry: Palpation: Prolapse:   Gait Analysis: Deferred to follow-up   Pelvic Floor Outcome Measures: PFDI: 110/300, PFIQ: 33/300  INTERVENTIONS THIS SESSION: Therex: educated on and practiced diaphragmatic breathing to decrease stress levels, improve PFM  Relaxation, and prepare for follow-up appointments. Self-care: Educated on the structure and function of the pelvic floor in relation to their symptoms as well as the POC, and initial HEP in order to set patient expectations and understanding from which we will build on in the future sessions.   Total time: 65 min.      [facility] 05[facility]15/19 0001      Assessment   Medical Diagnosis  Mixed Incontinence    Referring Provider (PT)  Doreene Burkehompson, Annie    Prior Therapy  none      Precautions   Precautions  None      Restrictions   Weight Bearing Restrictions  No      Balance Screen   Has the patient fallen in the past 6 months  No      Home Environment   Living Environment  Private residence    Living Arrangements  Spouse/significant other;Children    Type of Home  House    Home Access  Stairs to enter    Entrance Stairs-Number of Steps  3    Entrance Stairs-Rails  Left    Home Layout  One level      Prior Function    Level of Independence  Independent    Vocation  Full time employment    Electrical engineerVocation Requirements  Mortgage Lender      Cognition   Overall Cognitive Status  Within Functional Limits for tasks assessed                Objective measurements completed on examination: See above findings.                PT Short Term Goals - 05/15/19 21300952      PT SHORT TERM GOAL #1   Title  Patient will demonstrate improved pelvic alignment and balance of musculature surrounding the pelvis to facilitate decreased PFM spasms and decrease pelvic pain.    Baseline  LLE long, hyperlordotic,  spasms surrounding pelvis    Time  5    Period  Weeks    Status  New    Target Date  06/19/19      PT SHORT TERM GOAL #2   Title  Patient will demonstrate HEP x1 in the clinic to demonstrate understanding and proper form to allow for further improvement.    Baseline  Pt. lacks knowledge of therepeutic exercises that will decrease SUI and pain.    Time  5    Period  Weeks    Status  New    Target Date  06/19/19      PT SHORT TERM GOAL #3   Title  Patient will demonstrate coordinated diaphragmatic breathing with pelvic tilts to demonstrate improved control of diaphragm and TA, to allow for further strengthening of core musculature and decreased pelvic floor spasm.    Baseline  Diastasis recti, poor coordination of PFM evidenced by incomplete emptying with grade 4 BM.    Time  5    Period  Weeks    Status  New    Target Date  06/19/19        PT Long Term Goals - 05/15/19 0955      PT LONG TERM GOAL #1   Title  Patient will report no episodes of SUI over the course of the prior two weeks to demonstrate improved functional ability.    Baseline  Having SUI with stress and without sensory awareness, needing 1 pantyliner    Time  10    Period  Weeks    Status  New    Target Date  07/24/19      PT LONG TERM GOAL #2   Title  Patient will report no pain with intercourse to demonstrate improved  functional ability.    Baseline  Having pain with deep thrusting, not lubricating well, having burning following intercourse    Time  10    Period  Weeks    Status  New    Target Date  07/24/19      PT LONG TERM GOAL #3   Title  Patient will score at or below 65/300 on the PFDI and 16/300 on the PFIQ to demonstrate a clinically meaningful decrease in disability and distress due to pelvic floor dysfunction.    Baseline  PFDI: 110/300, PFIQ: 33/300    Time  10    Period  Weeks    Status  New    Target Date  07/24/19             Plan - 05/15/19 0939    Clinical Impression Statement  Pt. is a 33 y/o female who presents today with cheif c/o SUI and chronic LBP as well as dyspareunia. Her PMH is significan for interstitial cystitis and IBS as well as 2 vaginal deliveries, the first with some tearing. Her Clinical exam reveals a diastasis recti, a 1 cm. leg-length discrepancy with the LLE long and decreased lumbar ROM with hyperlordosis and spasms through all hip flexors, extensors, and lumbar extensors as well as adductors. She will benefit from skilled pelvic PT to address the noted defecits and to continue to assess for and address other potential causes of symptoms.    Personal Factors and Comorbidities  Comorbidity 3+    Comorbidities  IC, IBS, anxiety    Examination-Activity Limitations  Continence;Lift;Squat;Locomotion Level;Stand;Transfers    Examination-Participation Restrictions  Interpersonal Relationship;Yard Work;Cleaning;Shop    Stability/Clinical Decision Making  Unstable/Unpredictable    Clinical Decision Making  High    Rehab Potential  Good    PT Frequency  1x / week    PT Duration  Other (comment)   10 weeks   PT Treatment/Interventions  ADLs/Self Care Home Management;Biofeedback;Moist Heat;Traction;Electrical Stimulation;Balance training;Therapeutic exercise;Therapeutic activities;Functional mobility training;Neuromuscular re-education;Patient/family education;Manual  techniques;Dry needling;Scar mobilization;Taping;Spinal Manipulations;Joint Manipulations;Orthotic Fit/Training    PT Next Visit Plan  spasm reduction surrounding the pelvis, PA mobs to sacrum, give heel-lift for RLE    PT Home Exercise Plan  diaphragmatic breathing    Consulted and Agree with Plan of Care  Patient       Patient will benefit from skilled therapeutic intervention in order to improve the following deficits and impairments:  Decreased mobility, Increased muscle spasms, Decreased range of motion, Improper body mechanics, Decreased activity tolerance, Decreased coordination, Pain, Postural dysfunction  Visit Diagnosis: 1. Abnormal posture   2. Other muscle spasm   3. Chronic midline low back pain, unspecified whether sciatica present        Problem List There are no active problems to display for this patient.  Cleophus MoltKeeli T. Ladean Steinmeyer DPT, ATC Cleophus MoltKeeli T Manju Kulkarni 05/15/2019, 10:09 AM  [place] [facility] [address] [place], [place], [address] Phone: [phone]   Fax:  [phone]  Name: Yolanda MangesBrittany M Hillhouse MRN: [id number] Date of Birth: January 06, 1986

## 2019-05-22 ENCOUNTER — Other Ambulatory Visit: Payer: Self-pay

## 2019-05-22 ENCOUNTER — Ambulatory Visit: Payer: BC Managed Care – PPO | Attending: Certified Nurse Midwife

## 2019-05-22 DIAGNOSIS — M545 Low back pain, unspecified: Secondary | ICD-10-CM

## 2019-05-22 DIAGNOSIS — M62838 Other muscle spasm: Secondary | ICD-10-CM | POA: Insufficient documentation

## 2019-05-22 DIAGNOSIS — R293 Abnormal posture: Secondary | ICD-10-CM | POA: Diagnosis not present

## 2019-05-22 DIAGNOSIS — G8929 Other chronic pain: Secondary | ICD-10-CM | POA: Insufficient documentation

## 2019-05-22 NOTE — Therapy (Signed)
[place] MAIN [facility] SERVICES [address] [place], [place], [address] Phone: [phone]   Fax:  [phone]  Physical Therapy Treatment  The patient has been informed of current processes in place at Outpatient Rehab to protect patients from Covid-19 exposure including social distancing, schedule modifications, and new cleaning procedures. After discussing their particular risk with a therapist based on the patient's personal risk factors, the patient has decided to proceed with in-person therapy.   Patient Details  Name: Haley Wong MRN: [id number] Date of Birth: August 08, 1986 Referring Provider (PT): Philip Aspen   Encounter Date: 05/22/2019  PT End of Session - 05/22/19 1422    Visit Number  2    Number of Visits  10    Date for PT Re-Evaluation  07/23/19    Authorization Type  BCBS    Authorization - Visit Number  2    Authorization - Number of Visits  10    PT Start Time  9485    PT Stop Time  1130    PT Time Calculation (min)  60 min    Activity Tolerance  Patient tolerated treatment well;No increased pain    Behavior During Therapy  WFL for tasks assessed/performed       Past Medical History:  Diagnosis Date  . Anemia   . Anxiety    uses albuterol prn for SOB caused by anxiety  . Asthma   . Chronic interstitial cystitis   . Dysrhythmia    tachycardia on B blockers  . GERD (gastroesophageal reflux disease)   . Headache(784.0)   . Hx of gastric ulcer   . Hx of varicella   . IBS (irritable bowel syndrome)   . 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 Tachycardia    take metoprolol 25mg  daily since 33 yo  . Unspecified hemorrhoids without mention of complication     Past Surgical History:  Procedure Laterality Date  . LAPAROSCOPY  2009   exploratory surgery, diagnosed interstitial cystitis    There were no vitals filed for this visit.    Pelvic Floor Physical Therapy Treatment Note  SCREENING  Changes in  medications, allergies, or medical history?: no    SUBJECTIVE  Patient reports: Doing the same as at Eval. R hip keeps "popping out"  Precautions:  none  Pain update:  Location of pain: R hip Current pain:  4/10  Max pain:  7/10 Least pain:  3/10 Nature of pain: dull ache  Patient Goals: Decrease bladder leakage   OBJECTIVE  Changes in: Posture/Observations:  LLE up-slipped   Palpation: TTP to B hip-flexors  Gait Analysis: Slow, cautious  INTERVENTIONS THIS SESSION: Manual: Performed TP release and STM to L Psoas and Iliacus to decrease spasm and pain and allow for improved balance of musculature for improved function and decreased symptoms. Dry-needle: Performed TPDN with standard approach and .30x36mm needle to L Psoas and iliacus to decrease spasm and pain and allow for improved balance of musculature for improved function and decreased symptoms. Therex: Educated on and practiced pelvic tilts in seated to improve strength of muscles opposing tight musculature to allow reciprocal inhibition to improve balance of musculature surrounding the pelvis and improve overall posture for optimal musculature length-tension relationship and function. Educated on and practiced hip-flexor hold-relax lengthening and stretch To maintain and improve muscle length and allow for improved balance of musculature for long-term symptom relief. Self-care: educated on and given heel-lift to allow for improved pelvic alignment and decreased shearing force at  the low back for decreased pain.  Total time: 60 min.                            PT Short Term Goals - 05/15/19 16100952      PT SHORT TERM GOAL #1   Title  Patient will demonstrate improved pelvic alignment and balance of musculature surrounding the pelvis to facilitate decreased PFM spasms and decrease pelvic pain.    Baseline  LLE long, hyperlordotic, spasms surrounding pelvis    Time  5    Period  Weeks     Status  New    Target Date  06/19/19      PT SHORT TERM GOAL #2   Title  Patient will demonstrate HEP x1 in the clinic to demonstrate understanding and proper form to allow for further improvement.    Baseline  Pt. lacks knowledge of therepeutic exercises that will decrease SUI and pain.    Time  5    Period  Weeks    Status  New    Target Date  06/19/19      PT SHORT TERM GOAL #3   Title  Patient will demonstrate coordinated diaphragmatic breathing with pelvic tilts to demonstrate improved control of diaphragm and TA, to allow for further strengthening of core musculature and decreased pelvic floor spasm.    Baseline  Diastasis recti, poor coordination of PFM evidenced by incomplete emptying with grade 4 BM.    Time  5    Period  Weeks    Status  New    Target Date  06/19/19        PT Long Term Goals - 05/15/19 0955      PT LONG TERM GOAL #1   Title  Patient will report no episodes of SUI over the course of the prior two weeks to demonstrate improved functional ability.    Baseline  Having SUI with stress and without sensory awareness, needing 1 pantyliner    Time  10    Period  Weeks    Status  New    Target Date  07/24/19      PT LONG TERM GOAL #2   Title  Patient will report no pain with intercourse to demonstrate improved functional ability.    Baseline  Having pain with deep thrusting, not lubricating well, having burning following intercourse    Time  10    Period  Weeks    Status  New    Target Date  07/24/19      PT LONG TERM GOAL #3   Title  Patient will score at or below 65/300 on the PFDI and 16/300 on the PFIQ to demonstrate a clinically meaningful decrease in disability and distress due to pelvic floor dysfunction.    Baseline  PFDI: 110/300, PFIQ: 33/300    Time  10    Period  Weeks    Status  New    Target Date  07/24/19            Plan - 05/22/19 1422    Clinical Impression Statement  Pt. Responded well to all interventions today, demonstrating  decreased spasm and improved pelvic alignment as well as understanding and correct performance of all education and exercises provided today. They will continue to benefit from skilled physical therapy to work toward remaining goals and maximize function as well as decrease likelihood of symptom increase or recurrence.     Personal Factors  and Comorbidities  Comorbidity 3+    Comorbidities  IC, IBS, anxiety    Examination-Activity Limitations  Continence;Lift;Squat;Locomotion Level;Stand;Transfers    Examination-Participation Restrictions  Interpersonal Relationship;Yard Work;Cleaning;Shop    Stability/Clinical Decision Making  Unstable/Unpredictable    Rehab Potential  Good    PT Frequency  1x / week    PT Duration  Other (comment)   10 weeks   PT Treatment/Interventions  ADLs/Self Care Home Management;Biofeedback;Moist Heat;Traction;Electrical Stimulation;Balance training;Therapeutic exercise;Therapeutic activities;Functional mobility training;Neuromuscular re-education;Patient/family education;Manual techniques;Dry needling;Scar mobilization;Taping;Spinal Manipulations;Joint Manipulations;Orthotic Fit/Training    PT Next Visit Plan  DN to R hip-flexors, LB and Adductors, PA mobs to sacrum,    PT Home Exercise Plan  diaphragmatic breathing, pelvic tilts in seated, hip-flexor hold-relax and stretch    Consulted and Agree with Plan of Care  Patient       Patient will benefit from skilled therapeutic intervention in order to improve the following deficits and impairments:  Decreased mobility, Increased muscle spasms, Decreased range of motion, Improper body mechanics, Decreased activity tolerance, Decreased coordination, Pain, Postural dysfunction  Visit Diagnosis: 1. Abnormal posture   2. Other muscle spasm   3. Chronic midline low back pain, unspecified whether sciatica present        Problem List There are no active problems to display for this patient.  Cleophus MoltKeeli T. Kyira Volkert DPT,  ATC Cleophus MoltKeeli T Llewelyn Sheaffer 05/22/2019, 2:33 PM  [place] [facility] [address] [place], [place], [address] Phone: [phone]   Fax:  [phone]  Name: Yolanda MangesBrittany M Musto MRN: [id number] Date of Birth: [birth year]

## 2019-05-22 NOTE — Patient Instructions (Signed)
    Bring both knees up to your chest and then hold the one farthest from the edge of the table/bed and let the other relax toward the floor until stretch is felt through the front of the hip.  Hold the knee up just slightly for 5 seconds and then relax. Repeat 5 times before doing below stretch on each side.  Hold for __5__ deep belly breaths. Relax. Repeat __2-3__ times per side.   Do this __1-2__ times per day.    Flexors, Lunge  Hip Flexor Stretch: Proposal Pose (alternate to do throughout the day)    Maintain pelvic tuck under, lift pubic bone toward navel. Engage posterior hip muscles (firm glute muscles of leg in back position) and shift forward until you feel stretch on front of leg that is down. To increase stretch, maintain balance and ease hips forward. You may use one hand on a chair for balance if needed. Hold for __5__ breaths. Repeat __2-3__ times each leg.  Do _1-2__ times per day.    Do 2 sets of 15 tilts per day. Breathe in when you tilt forward (A) and out when you tuck under (B).    As you start wearing your heel-lift only wear it for an hour the first day and increase by an hour each day so you can allow for the body to adapt to the change easily without much pain. If your pain increases by more than 1-2 points, back off slightly or slow down how quickly you increase your wear time. Once you reach a full day of wear, use it as much as possible forever, even in house-shoes or flip-flops if necessary to keep yourself from reverting to bad pelvic and spinal alignment and having symptoms return.    Adjust-a-lift heel lift Can be found at [email or web address]

## 2019-05-28 ENCOUNTER — Ambulatory Visit: Payer: BC Managed Care – PPO

## 2019-05-28 ENCOUNTER — Other Ambulatory Visit: Payer: Self-pay

## 2019-05-28 DIAGNOSIS — M62838 Other muscle spasm: Secondary | ICD-10-CM

## 2019-05-28 DIAGNOSIS — M545 Low back pain, unspecified: Secondary | ICD-10-CM

## 2019-05-28 DIAGNOSIS — R293 Abnormal posture: Secondary | ICD-10-CM

## 2019-05-28 DIAGNOSIS — G8929 Other chronic pain: Secondary | ICD-10-CM

## 2019-05-28 NOTE — Therapy (Signed)
[facility] [address] [place], [place], [address] Phone: [phone]   Fax:  [phone]  Physical Therapy Treatment  The patient has been informed of current processes in place at Outpatient Rehab to protect patients from Covid-19 exposure including social distancing, schedule modifications, and new cleaning procedures. After discussing their particular risk with a therapist based on the patient's personal risk factors, the patient has decided to proceed with in-person therapy.   Patient Details  Name: Haley MangesBrittany M Wich MRN: [id number] Date of Birth: 1986-05-02 Referring Provider (PT): Doreene Burkehompson, Annie   Encounter Date: 05/28/2019  PT End of Session - 05/28/19 1224    Visit Number  3    Number of Visits  10    Date for PT Re-Evaluation  07/23/19    Authorization Type  BCBS    Authorization - Visit Number  3    Authorization - Number of Visits  10    PT Start Time  1100    PT Stop Time  1200    PT Time Calculation (min)  60 min    Activity Tolerance  Patient tolerated treatment well;No increased pain    Behavior During Therapy  WFL for tasks assessed/performed       Past Medical History:  Diagnosis Date  . Anemia   . Anxiety    uses albuterol prn for SOB caused by anxiety  . Asthma   . Chronic interstitial cystitis   . Dysrhythmia    tachycardia on B blockers  . GERD (gastroesophageal reflux disease)   . Headache(784.0)   . Hx of gastric ulcer   . Hx of varicella   . IBS (irritable bowel syndrome)   . Other hemoglobinopathies (HCC)   . Tachycardia    take metoprolol 25mg  daily since 33 yo  . Unspecified hemorrhoids without mention of complication     Past Surgical History:  Procedure Laterality Date  . LAPAROSCOPY  2009   exploratory surgery, diagnosed interstitial cystitis    There were no vitals filed for this visit.      [facility] - 05/28/19 0001      Balance Screen   Has the  patient fallen in the past 6 months  Yes   fell down 3 stairs landed on R buttock        Pelvic Floor Physical Therapy Treatment Note  SCREENING  Changes in medications, allergies, or medical history?: no    SUBJECTIVE  Patient reports: Her R hip is really irritated because she tripped and fell down 3 stairs, landed on her R hip/leg and twisted her L ankle so she has been overcompensating with her R LE.  Precautions:  none  Pain update:  Location of pain: R hip Current pain:  5/10  Max pain:  7/10 Least pain:  3/10 Nature of pain: dull ache  **pain 2-3/10 following treatment and noticing less pain even in LLE.  Patient Goals: Decrease bladder leakage   OBJECTIVE  Changes in: Posture/Observations:  Pelvis level, LLE long in supine.  Palpation: TTP to R hip-flexors, had radiating Sx. To lower abdomen, R posterior hip, back and strong urge to urinate during pressure/needling.  Gait Analysis: Slow, cautious  INTERVENTIONS THIS SESSION: Manual: Performed TP release and STM to R Psoas and Iliacus to decrease spasm and pain and allow for improved balance of musculature for improved function and decreased symptoms. Dry-needle: Performed TPDN with standard approach and .30x2975mm needle to R Psoas and iliacus to decrease  spasm and pain and allow for improved balance of musculature for improved function and decreased symptoms. Therex: Educated on and practiced TA in mod-quad to improve strength of muscles opposing tight musculature to allow reciprocal inhibition to improve balance of musculature surrounding the pelvis and improve overall posture for optimal musculature length-tension relationship and function. Self-care: Reviewed heel-lift gradual implementation to allow for improved pelvic alignment and decreased shearing force at the low back for decreased pain. Reminded to drink extra water and do light activity to decrease soreness from needling.  Total time: 60  min.               Trigger Point Dry Needling - 05/28/19 0001    Consent Given?  Yes    Education Handout Provided  No    Muscles Treated Back/Hip  Iliopsoas    Iliopsoas Response  Twitch response elicited;Palpable increased muscle length             PT Short Term Goals - 05/15/19 3810      PT SHORT TERM GOAL #1   Title  Patient will demonstrate improved pelvic alignment and balance of musculature surrounding the pelvis to facilitate decreased PFM spasms and decrease pelvic pain.    Baseline  LLE long, hyperlordotic, spasms surrounding pelvis    Time  5    Period  Weeks    Status  New    Target Date  06/19/19      PT SHORT TERM GOAL #2   Title  Patient will demonstrate HEP x1 in the clinic to demonstrate understanding and proper form to allow for further improvement.    Baseline  Pt. lacks knowledge of therepeutic exercises that will decrease SUI and pain.    Time  5    Period  Weeks    Status  New    Target Date  06/19/19      PT SHORT TERM GOAL #3   Title  Patient will demonstrate coordinated diaphragmatic breathing with pelvic tilts to demonstrate improved control of diaphragm and TA, to allow for further strengthening of core musculature and decreased pelvic floor spasm.    Baseline  Diastasis recti, poor coordination of PFM evidenced by incomplete emptying with grade 4 BM.    Time  5    Period  Weeks    Status  New    Target Date  06/19/19        PT Long Term Goals - 05/15/19 0955      PT LONG TERM GOAL #1   Title  Patient will report no episodes of SUI over the course of the prior two weeks to demonstrate improved functional ability.    Baseline  Having SUI with stress and without sensory awareness, needing 1 pantyliner    Time  10    Period  Weeks    Status  New    Target Date  07/24/19      PT LONG TERM GOAL #2   Title  Patient will report no pain with intercourse to demonstrate improved functional ability.    Baseline  Having pain with  deep thrusting, not lubricating well, having burning following intercourse    Time  10    Period  Weeks    Status  New    Target Date  07/24/19      PT LONG TERM GOAL #3   Title  Patient will score at or below 65/300 on the PFDI and 16/300 on the PFIQ to demonstrate a clinically meaningful decrease  in disability and distress due to pelvic floor dysfunction.    Baseline  PFDI: 110/300, PFIQ: 33/300    Time  10    Period  Weeks    Status  New    Target Date  07/24/19            Plan - 05/28/19 1224    Clinical Impression Statement  Pt. Responded well to all interventions today, demonstrating decreased spasms and pain reduction from 5/10 to ~2/10 as well as understanding and correct performance of all education and exercises provided today. They will continue to benefit from skilled physical therapy to work toward remaining goals and maximize function as well as decrease likelihood of symptom increase or recurrence.     Personal Factors and Comorbidities  Comorbidity 3+    Comorbidities  IC, IBS, anxiety    Examination-Activity Limitations  Continence;Lift;Squat;Locomotion Level;Stand;Transfers    Examination-Participation Restrictions  Interpersonal Relationship;Yard Work;Cleaning;Shop    Stability/Clinical Decision Making  Unstable/Unpredictable    Rehab Potential  Good    PT Frequency  1x / week    PT Duration  Other (comment)   10 weeks   PT Treatment/Interventions  ADLs/Self Care Home Management;Biofeedback;Moist Heat;Traction;Electrical Stimulation;Balance training;Therapeutic exercise;Therapeutic activities;Functional mobility training;Neuromuscular re-education;Patient/family education;Manual techniques;Dry needling;Scar mobilization;Taping;Spinal Manipulations;Joint Manipulations;Orthotic Fit/Training    PT Next Visit Plan  DN to LB and Adductors, PA mobs to sacrum, give 3-way wall stretch assess neck and R shoulder/treat.    PT Home Exercise Plan  diaphragmatic breathing,  pelvic tilts in seated, hip-flexor hold-relax and stretch, TA in mod-quad    Consulted and Agree with Plan of Care  Patient       Patient will benefit from skilled therapeutic intervention in order to improve the following deficits and impairments:  Decreased mobility, Increased muscle spasms, Decreased range of motion, Improper body mechanics, Decreased activity tolerance, Decreased coordination, Pain, Postural dysfunction  Visit Diagnosis: 1. Abnormal posture   2. Other muscle spasm   3. Chronic midline low back pain, unspecified whether sciatica present        Problem List There are no active problems to display for this patient.  Cleophus MoltKeeli T. Gailes DPT, ATC Cleophus MoltKeeli T Gailes 05/28/2019, 12:27 PM  [facility] [address] [place], [place], [address] Phone: [phone]   Fax:  [phone]  Name: Haley MangesBrittany M Mackert MRN: [id number] Date of Birth: 1986-02-17

## 2019-05-28 NOTE — Patient Instructions (Signed)
   Breathe in, let belly relax down toward the floor and then breathe out, pulling the lower belly in toward the backbone.   Repeat this _10x3__ times _1-3__ times per day     

## 2019-06-04 ENCOUNTER — Ambulatory Visit: Payer: BC Managed Care – PPO

## 2019-06-04 ENCOUNTER — Other Ambulatory Visit: Payer: Self-pay

## 2019-06-04 DIAGNOSIS — M62838 Other muscle spasm: Secondary | ICD-10-CM | POA: Diagnosis not present

## 2019-06-04 DIAGNOSIS — M545 Low back pain: Secondary | ICD-10-CM | POA: Diagnosis not present

## 2019-06-04 DIAGNOSIS — R293 Abnormal posture: Secondary | ICD-10-CM | POA: Diagnosis not present

## 2019-06-04 DIAGNOSIS — G8929 Other chronic pain: Secondary | ICD-10-CM

## 2019-06-04 NOTE — Therapy (Signed)
[facility] SERVICES [address] [place], [place], [address] Phone: [phone]   Fax:  [phone]  Physical Therapy Treatment  The patient has been informed of current processes in place at Outpatient Rehab to protect patients from Covid-19 exposure including social distancing, schedule modifications, and new cleaning procedures. After discussing their particular risk with a therapist based on the patient's personal risk factors, the patient has decided to proceed with in-person therapy.   Patient Details  Name: Haley Wong MRN: [id number] Date of Birth: 1986-08-08 Referring Provider (PT): Philip Aspen   Encounter Date: 06/04/2019  PT End of Session - 06/04/19 1415    Visit Number  4    Number of Visits  10    Date for PT Re-Evaluation  07/23/19    Authorization Type  BCBS    Authorization - Visit Number  4    Authorization - Number of Visits  10    PT Start Time  1108    PT Stop Time  1205    PT Time Calculation (min)  57 min    Activity Tolerance  Patient tolerated treatment well    Behavior During Therapy  [facility] for tasks assessed/performed       Past Medical History:  Diagnosis Date  . Anemia   . Anxiety    uses albuterol prn for SOB caused by anxiety  . Asthma   . Chronic interstitial cystitis   . Dysrhythmia    tachycardia on B blockers  . GERD (gastroesophageal reflux disease)   . Headache(784.0)   . Hx of gastric ulcer   . Hx of varicella   . IBS (irritable bowel syndrome)   . Other hemoglobinopathies ([place])   . Tachycardia    take metoprolol 25mg  daily since 33 yo  . Unspecified hemorrhoids without mention of complication     Past Surgical History:  Procedure Laterality Date  . LAPAROSCOPY  2009   exploratory surgery, diagnosed interstitial cystitis    There were no vitals filed for this visit.     Pelvic Floor Physical Therapy Treatment Note  SCREENING  Changes in medications,  allergies, or medical history?: no    SUBJECTIVE  Patient reports: Her R hip is really irritated still and it was worse when at the beach, walking in sand. She is still only wearing the heel-lift ~ 1 hour a day when she goes for a walk, feels that it makes everything feel "tight" through her hip. Has been drinking a lot more water, has been running to the bathroom. Having urge/frequency still but less leakage.  Precautions:  none  Pain update:  Location of pain: R hip Current pain:  5/10  Max pain:  7/10 Least pain:  3/10 Nature of pain: dull ache  **pain 2-3/10 following treatment and noticing less pain even in LLE.  Patient Goals: Decrease bladder leakage   OBJECTIVE  Changes in: Posture/Observations:  Pelvis level, LLE long in supine.  ROM/Mobility: Decreased mobility through B sacral borders, radiating up the spine with pressure and into the lower abdomen.  Palpation: TTP to L Piriformis, glute min. and HS   INTERVENTIONS THIS SESSION: Manual: Performed TP release and to  L Piriformis, glute min. and HS to decrease spasm and pain and allow for improved balance of musculature for improved function and decreased symptoms. Performed grade 3-4 mobs to B sacral borders to improve mobility of joint and surrounding connective tissue and decrease pressure on nerve roots for improved conductivity  and function of down-stream tissues.  Therex: Educated on and practiced three-way wall stretches To maintain and improve muscle length and allow for improved balance of musculature for long-term symptom relief.   Total time: 57 min.                                PT Short Term Goals - 05/15/19 [id number]      PT SHORT TERM GOAL #1   Title  Patient will demonstrate improved pelvic alignment and balance of musculature surrounding the pelvis to facilitate decreased PFM spasms and decrease pelvic pain.    Baseline  LLE long, hyperlordotic, spasms surrounding  pelvis    Time  5    Period  Weeks    Status  New    Target Date  06/19/19      PT SHORT TERM GOAL #2   Title  Patient will demonstrate HEP x1 in the clinic to demonstrate understanding and proper form to allow for further improvement.    Baseline  Pt. lacks knowledge of therepeutic exercises that will decrease SUI and pain.    Time  5    Period  Weeks    Status  New    Target Date  06/19/19      PT SHORT TERM GOAL #3   Title  Patient will demonstrate coordinated diaphragmatic breathing with pelvic tilts to demonstrate improved control of diaphragm and TA, to allow for further strengthening of core musculature and decreased pelvic floor spasm.    Baseline  Diastasis recti, poor coordination of PFM evidenced by incomplete emptying with grade 4 BM.    Time  5    Period  Weeks    Status  New    Target Date  06/19/19        PT Long Term Goals - 05/15/19 0955      PT LONG TERM GOAL #1   Title  Patient will report no episodes of SUI over the course of the prior two weeks to demonstrate improved functional ability.    Baseline  Having SUI with stress and without sensory awareness, needing 1 pantyliner    Time  10    Period  Weeks    Status  New    Target Date  07/24/19      PT LONG TERM GOAL #2   Title  Patient will report no pain with intercourse to demonstrate improved functional ability.    Baseline  Having pain with deep thrusting, not lubricating well, having burning following intercourse    Time  10    Period  Weeks    Status  New    Target Date  07/24/19      PT LONG TERM GOAL #3   Title  Patient will score at or below 65/300 on the PFDI and 16/300 on the PFIQ to demonstrate a clinically meaningful decrease in disability and distress due to pelvic floor dysfunction.    Baseline  PFDI: 110/300, PFIQ: 33/300    Time  10    Period  Weeks    Status  New    Target Date  07/24/19            Plan - 06/04/19 1415    Clinical Impression Statement  Pt. Responded well  to all interventions today, demonstrating improved sacral mobility, decreased spasm, as well as understanding and correct performance of all education and exercises provided today. They will continue to  benefit from skilled physical therapy to work toward remaining goals and maximize function as well as decrease likelihood of symptom increase or recurrence.     Personal Factors and Comorbidities  Comorbidity 3+    Comorbidities  IC, IBS, anxiety    Examination-Activity Limitations  Continence;Lift;Squat;Locomotion Level;Stand;Transfers    Examination-Participation Restrictions  Interpersonal Relationship;Yard Work;Cleaning;Shop    Stability/Clinical Decision Making  Unstable/Unpredictable    Rehab Potential  Good    PT Frequency  1x / week    PT Duration  Other (comment)   10 weeks   PT Treatment/Interventions  ADLs/Self Care Home Management;Biofeedback;Moist Heat;Traction;Electrical Stimulation;Balance training;Therapeutic exercise;Therapeutic activities;Functional mobility training;Neuromuscular re-education;Patient/family education;Manual techniques;Dry needling;Scar mobilization;Taping;Spinal Manipulations;Joint Manipulations;Orthotic Fit/Training    PT Next Visit Plan  DN to LB and Adductors, Educate on urge-suppression technique, hip strengthening, internal?  assess neck and R shoulder/treat.    PT Home Exercise Plan  diaphragmatic breathing, pelvic tilts in seated, hip-flexor hold-relax and stretch, TA in mod-quad    Consulted and Agree with Plan of Care  Patient       Patient will benefit from skilled therapeutic intervention in order to improve the following deficits and impairments:  Decreased mobility, Increased muscle spasms, Decreased range of motion, Improper body mechanics, Decreased activity tolerance, Decreased coordination, Pain, Postural dysfunction  Visit Diagnosis: 1. Abnormal posture   2. Other muscle spasm   3. Chronic midline low back pain, unspecified whether sciatica  present        Problem List There are no active problems to display for this patient.  Cleophus MoltKeeli T.  DPT, ATC Cleophus MoltKeeli T  06/04/2019, 2:22 PM  [facility] [facility] [address] [place], [place], [address] Phone: [phone]   Fax:  [phone]  Name: Yolanda MangesBrittany M Finkler MRN: [id number] Date of Birth: 09/25/1986

## 2019-06-04 NOTE — Patient Instructions (Signed)
3-Way Wall Stretches for Pelvic Floor Lengthening   Bring bottom close to the wall and gently press straighten knees to feel a stretch down the back of you thighs. Hold while taking 5 deep belly breaths and feeling the pelvic floor relax and lower on each inhale.    Let your legs fall to the side to feel a stretch on the inside of your thighs. If the stretch is too intense you can use a pillow to take some of the weight off by wedging it on the outside of your hips. Hold while taking 5 deep belly breaths and feeling the pelvic floor relax and lower on each inhale.    Slide feet down the wall and move hips slightly away from the wall and then let your knees fall to the sides so you feel a stretch on the inside of the thighs near your groin. Hold while taking 5 deep belly breaths and feeling the pelvic floor relax and lower on each inhale.     *Perform each stretch in sequence 3 times, once a day   Do 15x2 on each side, 1 time per day.

## 2019-06-10 ENCOUNTER — Other Ambulatory Visit: Payer: Self-pay

## 2019-06-10 ENCOUNTER — Encounter: Payer: Self-pay | Admitting: Urology

## 2019-06-10 ENCOUNTER — Ambulatory Visit (INDEPENDENT_AMBULATORY_CARE_PROVIDER_SITE_OTHER): Payer: BC Managed Care – PPO | Admitting: Urology

## 2019-06-10 VITALS — BP 121/78 | HR 98 | Ht 63.0 in | Wt 196.6 lb

## 2019-06-10 DIAGNOSIS — R3915 Urgency of urination: Secondary | ICD-10-CM

## 2019-06-10 DIAGNOSIS — R350 Frequency of micturition: Secondary | ICD-10-CM

## 2019-06-10 DIAGNOSIS — N3946 Mixed incontinence: Secondary | ICD-10-CM | POA: Diagnosis not present

## 2019-06-10 LAB — BLADDER SCAN AMB NON-IMAGING: Scan Result: 0

## 2019-06-10 NOTE — Progress Notes (Signed)
06/10/2019 1:06 PM   Haley Wong 1985/12/05 [id number]  Referring provider: Marina GoodellFeldpausch, Dale E, MD [address] [place],  [place] [id number]  Chief Complaint  Patient presents with  . Urinary Frequency    HPI: Assess the patient's incontinence and pelvic pain syndrome.  She was diagnosed many years ago with interstitial cystitis.  She said she benefit from a hydrodistention.  She went on [place] on and is not certain if it helped because the hydrodistention helped as well.  She stopped it a number years ago when she had her children.  The last 18 months she feels vaginal burning or irritation.  It is present most days.  It is rather steady.  It may be temporarily and mildly relieved by voiding.  She is now becoming more incontinent in the last 1 to 2 years and has noticed an odor.  She is not sure she has a bladder infection.  She can void and then stand and cough and leak.  She can otherwise leak with coughing sneezing but not on every occasion.  She does not leak with bending and other Valsalva.  She has a little bit urge incontinence.  She might have dampness while she sleeping.  She wears 2-3 pads a day damp to moderately wet  She can void 2 or 3 times in an hour primarily due to urgency but could hold it for 2 hours.  She can void twice when she first goes to bed but otherwise has no nighttime frequency  Modifying factors: There are no other modifying factors  Associated signs and symptoms: There are no other associated signs and symptoms Aggravating and relieving factors: There are no other aggravating or relieving factors Severity: Moderate Duration: Persistent   PMH: Past Medical History:  Diagnosis Date  . Anemia   . Anxiety    uses albuterol prn for SOB caused by anxiety  . Asthma   . Chronic interstitial cystitis   . Dysrhythmia    tachycardia on B blockers  . GERD (gastroesophageal reflux disease)   . Headache(784.0)   . Hx of gastric ulcer   . Hx of varicella    . IBS (irritable bowel syndrome)   . Other hemoglobinopathies (HCC)   . Tachycardia    take metoprolol 25mg  daily since 33 yo  . Unspecified hemorrhoids without mention of complication     Surgical History: Past Surgical History:  Procedure Laterality Date  . LAPAROSCOPY  2009   exploratory surgery, diagnosed interstitial cystitis    Home Medications:  Allergies as of 06/10/2019      Reactions   Amoxicillin Anaphylaxis   Hydrocodone-homatropine Itching, Other (See Comments)   Caused restlessness   Latex Rash   Other Rash, Other (See Comments)   Avoids due to gastritis on 06/09/14 EGD   Pineapple Other (See Comments)   Throat and tongue swells Throat and tongue swells      Medication List       Accurate as of June 10, 2019  1:06 PM. If you have any questions, ask your nurse or doctor.        rizatriptan 10 MG tablet Commonly known as: MAXALT Take 1 tablet (10 mg total) by mouth as needed for migraine. May repeat in 2 hours if needed   valACYclovir 1000 MG tablet Commonly known as: VALTREX TAKE 2 TABLETS(2000 MG) BY MOUTH TWICE DAILY       Allergies:  Allergies  Allergen Reactions  . Amoxicillin Anaphylaxis  . Hydrocodone-Homatropine Itching  and Other (See Comments)    Caused restlessness  . Latex Rash  . Other Rash and Other (See Comments)    Avoids due to gastritis on 06/09/14 EGD  . Pineapple Other (See Comments)    Throat and tongue swells Throat and tongue swells    Family History: Family History  Problem Relation Age of Onset  . Hypothyroidism Maternal Aunt   . Diabetes Maternal Grandmother   . Kidney disease Maternal Grandmother   . Hypothyroidism Maternal Grandmother   . Diabetes Maternal Grandfather   . Hypertension Maternal Grandfather   . Heart attack Maternal Grandfather   . Hypertension Paternal Grandfather   . Hypothyroidism Maternal Aunt   . Hypothyroidism Maternal Aunt   . Hypothyroidism Maternal Aunt     Social History:   reports that she has never smoked. She has never used smokeless tobacco. She reports current alcohol use. She reports that she does not use drugs.  ROS:                                        Physical Exam: There were no vitals taken for this visit.  Constitutional:  Alert and oriented, No acute distress. HEENT: [place] AT, moist mucus membranes.  Trachea midline, no masses. Cardiovascular: No clubbing, cyanosis, or edema. Respiratory: Normal respiratory effort, no increased work of breathing. GI: Abdomen is soft, nontender, nondistended, no abdominal masses GU: Mild hypermobility the bladder neck and no prolapse a negative cough test with a light cough; but he present.  With patient okay I removed it with a sterile clamp.  It turned out to be a menstrual cup that she forgot she was wearing.  Reassurance given.  No vaginal injury. Skin: No rashes, bruises or suspicious lesions. Lymph: No cervical or inguinal adenopathy. Neurologic: Grossly intact, no focal deficits, moving all 4 extremities. Psychiatric: Normal mood and affect.  Laboratory Data: Lab Results  Component Value Date   WBC 5.1 10/26/2016   HGB 13.3 10/26/2016   HCT 40.2 10/26/2016   MCV 89 10/26/2016   PLT 306 10/26/2016    Lab Results  Component Value Date   CREATININE 0.94 08/22/2018    No results found for: PSA  Lab Results  Component Value Date   TESTOSTERONE 49 (H) 10/26/2016    Lab Results  Component Value Date   HGBA1C 4.9 08/22/2018    Urinalysis    Component Value Date/Time   COLORURINE Straw 04/29/2014 1050   COLORURINE YELLOW 10/27/2010 2055   APPEARANCEUR Clear 04/29/2014 1050   LABSPEC 1.006 04/29/2014 1050   PHURINE 9.0 04/29/2014 1050   PHURINE 6.0 10/27/2010 2055   GLUCOSEU Negative 04/29/2014 1050   HGBUR Negative 04/29/2014 [place] 10/27/2010 2055   BILIRUBINUR neg 11/03/2017 1408   BILIRUBINUR Negative 04/29/2014 1050   KETONESUR Negative  04/29/2014 1050   KETONESUR 15 (A) 10/27/2010 2055   PROTEINUR neg 11/03/2017 1408   PROTEINUR Negative 04/29/2014 1050   PROTEINUR NEGATIVE 10/27/2010 2055   UROBILINOGEN 0.2 11/03/2017 1408   UROBILINOGEN 0.2 10/27/2010 2055   NITRITE neg 11/03/2017 1408   NITRITE Negative 04/29/2014 1050   NITRITE NEGATIVE 10/27/2010 2055   LEUKOCYTESUR Negative 11/03/2017 1408   LEUKOCYTESUR Trace 04/29/2014 1050    Pertinent Imaging:   Assessment & Plan: Patient has mixed incontinence.  She has a history of in keeping with interstitial cystitis.  She is responded to  hydrodistention in the past.  The role of urodynamics discussed.  Conservative therapy for stress incontinence would be prudent.  Physical therapy would obviously be a good option in the future to recommend.  I will call if urine culture is positive.  I did not recommend at this time a hydrodistention.  She said the incontinence is her main complaint.  She understands the concept of incontinence versus an inflamed bladder     1. Urinary urgency  - Urinalysis, Complete - Bladder Scan (Post Void Residual) in office  2. Urinary frequency  - Urinalysis, Complete - Bladder Scan (Post Void Residual) in office   No follow-ups on file.  Martina SinnerScott A Neysa Arts, MD  [facility] [address], [address] [place], [place] [address] [phone]

## 2019-06-11 LAB — MICROSCOPIC EXAMINATION

## 2019-06-11 LAB — URINALYSIS, COMPLETE
Bilirubin, UA: NEGATIVE
Glucose, UA: NEGATIVE
Nitrite, UA: NEGATIVE
Protein,UA: NEGATIVE
Specific Gravity, UA: 1.025 (ref 1.005–1.030)
Urobilinogen, Ur: 0.2 mg/dL (ref 0.2–1.0)
pH, UA: 7 (ref 5.0–7.5)

## 2019-06-12 ENCOUNTER — Ambulatory Visit: Payer: BC Managed Care – PPO

## 2019-06-12 LAB — CULTURE, URINE COMPREHENSIVE

## 2019-06-14 ENCOUNTER — Other Ambulatory Visit: Payer: Self-pay

## 2019-06-14 ENCOUNTER — Ambulatory Visit: Payer: BC Managed Care – PPO

## 2019-06-14 DIAGNOSIS — M62838 Other muscle spasm: Secondary | ICD-10-CM

## 2019-06-14 DIAGNOSIS — G8929 Other chronic pain: Secondary | ICD-10-CM | POA: Diagnosis not present

## 2019-06-14 DIAGNOSIS — R293 Abnormal posture: Secondary | ICD-10-CM

## 2019-06-14 DIAGNOSIS — M545 Low back pain: Secondary | ICD-10-CM | POA: Diagnosis not present

## 2019-06-14 NOTE — Therapy (Signed)
[place] [facility] MAIN [facility] SERVICES [address] [place], [place], [address] Phone: [phone]   Fax:  [phone]  Physical Therapy Treatment  The patient has been informed of current processes in place at Outpatient Rehab to protect patients from Covid-19 exposure including social distancing, schedule modifications, and new cleaning procedures. After discussing their particular risk with a therapist based on the patient's personal risk factors, the patient has decided to proceed with in-person therapy.   Patient Details  Name: Haley Wong MRN: [id number] Date of Birth: [birth year] Referring Provider (PT): Doreene Burke   Encounter Date: 06/14/2019  PT End of Session - 06/14/19 1009    Visit Number  5    Number of Visits  10    Date for PT Re-Evaluation  07/23/19    Authorization Type  BCBS    Authorization - Visit Number  5    Authorization - Number of Visits  10    PT Start Time  0907    PT Stop Time  1007    PT Time Calculation (min)  60 min    Activity Tolerance  Patient tolerated treatment well    Behavior During Therapy  [facility] for tasks assessed/performed       Past Medical History:  Diagnosis Date  . Anemia   . Anxiety    uses albuterol prn for SOB caused by anxiety  . Asthma   . Chronic interstitial cystitis   . Dysrhythmia    tachycardia on B blockers  . GERD (gastroesophageal reflux disease)   . Headache(784.0)   . Hx of gastric ulcer   . Hx of varicella   . IBS (irritable bowel syndrome)   . Other hemoglobinopathies (HCC)   . Tachycardia    take metoprolol 25mg  daily since 33 yo  . Unspecified hemorrhoids without mention of complication     Past Surgical History:  Procedure Laterality Date  . LAPAROSCOPY  2009   exploratory surgery, diagnosed interstitial cystitis    There were no vitals filed for this visit.    Pelvic Floor Physical Therapy Treatment Note  SCREENING  Changes in medications, allergies,  or medical history?: no    SUBJECTIVE  Patient reports: Took 3 days for hip to relax, had bruised feeling but it has been great since then. Has some neck/ shoulder pain from tubing this weekend. Having some vaginal burning sensation. Has some leakage without sensation still  Precautions:  none  Pain update:  Location of pain: Vulva, head/neck and R shoulder Current pain:  3/10  Max pain:  4/10 Least pain:  2/10 Nature of pain: dull ache  **looser/ more relaxed following treatment.  Patient Goals: Decrease bladder leakage   OBJECTIVE  Changes in:  Posture/Observations:  R C2-3 deviation of spinous process  ROM/Mobility: Decreased mobility of cervical vertebrae  Palpation: TTP to R sub-occipitals, deep cervical extensors.   INTERVENTIONS THIS SESSION: Manual: Performed TP release to  R sub-occipitals, deep cervical extensors and R to L grade 2-3 mobs to C2-3 to decrease spasm and pain and allow for improved balance of musculature for improved function and decreased symptoms and to improve mobility of joint and surrounding connective tissue and decrease pressure on nerve roots for improved conductivity and function of down-stream tissues.  Therex: Educated on and practiced supine chin-tucks with self mobilization of C2 to continue to improve mobility and decrease mal-alignment for overall decrease in nervous system sensitivity to allow for relaxation of distal tissues.   Total time:  60 min.                                 PT Short Term Goals - 05/15/19 [id number]      PT SHORT TERM GOAL #1   Title  Patient will demonstrate improved pelvic alignment and balance of musculature surrounding the pelvis to facilitate decreased PFM spasms and decrease pelvic pain.    Baseline  LLE long, hyperlordotic, spasms surrounding pelvis    Time  5    Period  Weeks    Status  New    Target Date  06/19/19      PT SHORT TERM GOAL #2   Title  Patient will  demonstrate HEP x1 in the clinic to demonstrate understanding and proper form to allow for further improvement.    Baseline  Pt. lacks knowledge of therepeutic exercises that will decrease SUI and pain.    Time  5    Period  Weeks    Status  New    Target Date  06/19/19      PT SHORT TERM GOAL #3   Title  Patient will demonstrate coordinated diaphragmatic breathing with pelvic tilts to demonstrate improved control of diaphragm and TA, to allow for further strengthening of core musculature and decreased pelvic floor spasm.    Baseline  Diastasis recti, poor coordination of PFM evidenced by incomplete emptying with grade 4 BM.    Time  5    Period  Weeks    Status  New    Target Date  06/19/19        PT Long Term Goals - 05/15/19 0955      PT LONG TERM GOAL #1   Title  Patient will report no episodes of SUI over the course of the prior two weeks to demonstrate improved functional ability.    Baseline  Having SUI with stress and without sensory awareness, needing 1 pantyliner    Time  10    Period  Weeks    Status  New    Target Date  07/24/19      PT LONG TERM GOAL #2   Title  Patient will report no pain with intercourse to demonstrate improved functional ability.    Baseline  Having pain with deep thrusting, not lubricating well, having burning following intercourse    Time  10    Period  Weeks    Status  New    Target Date  07/24/19      PT LONG TERM GOAL #3   Title  Patient will score at or below 65/300 on the PFDI and 16/300 on the PFIQ to demonstrate a clinically meaningful decrease in disability and distress due to pelvic floor dysfunction.    Baseline  PFDI: 110/300, PFIQ: 33/300    Time  10    Period  Weeks    Status  New    Target Date  07/24/19            Plan - 06/14/19 1009    Clinical Impression Statement  Pt. Responded well to all interventions today, demonstrating improved cervical mobility and decreased spasm and pain as well as understanding and  correct performance of all education and exercises provided today. They will continue to benefit from skilled physical therapy to work toward remaining goals and maximize function as well as decrease likelihood of symptom increase or recurrence.     Personal Factors and Comorbidities  Comorbidity 3+    Comorbidities  IC, IBS, anxiety    Examination-Activity Limitations  Continence;Lift;Squat;Locomotion Level;Stand;Transfers    Examination-Participation Restrictions  Interpersonal Relationship;Yard Work;Cleaning;Shop    Stability/Clinical Decision Making  Unstable/Unpredictable    Rehab Potential  Good    PT Frequency  1x / week    PT Duration  Other (comment)   10 weeks   PT Treatment/Interventions  ADLs/Self Care Home Management;Biofeedback;Moist Heat;Traction;Electrical Stimulation;Balance training;Therapeutic exercise;Therapeutic activities;Functional mobility training;Neuromuscular re-education;Patient/family education;Manual techniques;Dry needling;Scar mobilization;Taping;Spinal Manipulations;Joint Manipulations;Orthotic Fit/Training    PT Next Visit Plan  re-assess neck/R shoulder/treat. DN to LB and Adductors, Educate on urge-suppression technique, hip strengthening, internal?    PT Home Exercise Plan  diaphragmatic breathing, pelvic tilts in seated, hip-flexor hold-relax and stretch, TA in mod-quad    Consulted and Agree with Plan of Care  Patient       Patient will benefit from skilled therapeutic intervention in order to improve the following deficits and impairments:  Decreased mobility, Increased muscle spasms, Decreased range of motion, Improper body mechanics, Decreased activity tolerance, Decreased coordination, Pain, Postural dysfunction  Visit Diagnosis: Abnormal posture  Other muscle spasm  Chronic midline low back pain, unspecified whether sciatica present     Problem List There are no active problems to display for this patient.  Willa Rough DPT, ATC Willa Rough 06/14/2019, 12:12 PM  [place] MAIN [facility] SERVICES [address] [place], [place], [address] Phone: [phone]   Fax:  [phone]  Name: Haley Wong MRN: [id number] Date of Birth: [birth year]

## 2019-06-14 NOTE — Patient Instructions (Signed)
   Trigger point release tools    Use fingers to block highest vertebrae as you tuck (different from pictured) breathe out as you pull back, 2x15.

## 2019-06-18 ENCOUNTER — Other Ambulatory Visit: Payer: Self-pay

## 2019-06-18 ENCOUNTER — Ambulatory Visit: Payer: BC Managed Care – PPO | Attending: Certified Nurse Midwife

## 2019-06-18 DIAGNOSIS — R293 Abnormal posture: Secondary | ICD-10-CM | POA: Insufficient documentation

## 2019-06-18 DIAGNOSIS — M62838 Other muscle spasm: Secondary | ICD-10-CM | POA: Diagnosis not present

## 2019-06-18 DIAGNOSIS — M545 Low back pain, unspecified: Secondary | ICD-10-CM

## 2019-06-18 DIAGNOSIS — G8929 Other chronic pain: Secondary | ICD-10-CM | POA: Diagnosis not present

## 2019-06-18 NOTE — Patient Instructions (Signed)
   Gently tuck the chin in, then look down to stretch the back of the neck. Do the same thing before leaning to the side. Hold for 5 deep breaths, repeat 2 times in each direction. Use a heating pack first for 10-15 min. If you can.

## 2019-06-18 NOTE — Therapy (Signed)
[place] MAIN [facility] SERVICES [address] [place], [place], [address] Phone: [phone]   Fax:  [phone]  Physical Therapy Treatment  The patient has been informed of current processes in place at Outpatient Rehab to protect patients from Covid-19 exposure including social distancing, schedule modifications, and new cleaning procedures. After discussing their particular risk with a therapist based on the patient's personal risk factors, the patient has decided to proceed with in-person therapy.   Patient Details  Name: Haley Wong MRN: [id number] Date of Birth: May 12, 1986 Referring Provider (PT): Philip Aspen   Encounter Date: 06/18/2019  PT End of Session - 06/18/19 1238    Visit Number  6    Number of Visits  10    Date for PT Re-Evaluation  07/23/19    Authorization Type  BCBS    Authorization - Visit Number  6    Authorization - Number of Visits  10    PT Start Time  1108    PT Stop Time  1208    PT Time Calculation (min)  60 min    Activity Tolerance  Patient tolerated treatment well    Behavior During Therapy  [facility] for tasks assessed/performed       Past Medical History:  Diagnosis Date  . Anemia   . Anxiety    uses albuterol prn for SOB caused by anxiety  . Asthma   . Chronic interstitial cystitis   . Dysrhythmia    tachycardia on B blockers  . GERD (gastroesophageal reflux disease)   . Headache(784.0)   . Hx of gastric ulcer   . Hx of varicella   . IBS (irritable bowel syndrome)   . Other hemoglobinopathies ([place])   . Tachycardia    take metoprolol 25mg  daily since 33 yo  . Unspecified hemorrhoids without mention of complication     Past Surgical History:  Procedure Laterality Date  . LAPAROSCOPY  2009   exploratory surgery, diagnosed interstitial cystitis    There were no vitals filed for this visit.    Pelvic Floor Physical Therapy Treatment Note  SCREENING  Changes in medications, allergies,  or medical history?: no    SUBJECTIVE  Patient reports: Had increased pain after standing for ~ 3 hours. Felt good after last visit, HA came on with stressor on Monday. Has not slept well, stress level is high.  Precautions:  none  Pain update:  Location of pain: pelvis and R ankle, 6/10 HA Current pain:  2/10  Max pain:  5/10 (after prolonged standing Least pain:  2/10 Nature of pain: dull ache  **3/10 HA following treatment   Patient Goals: Decrease bladder leakage   OBJECTIVE  Changes in:  Posture/Observations:  R C2-3 deviation of spinous process  ROM/Mobility: Decreased mobility of cervical vertebrae  Palpation: TTP to R sub-occipitals, deep cervical extensors.   INTERVENTIONS THIS SESSION: Manual: Performed TP release to  R sub-occipitals, deep cervical extensors and R to L grade 2-3 mobs to C2-3 to decrease spasm and pain and allow for improved balance of musculature for improved function and decreased symptoms and to improve mobility of joint and surrounding connective tissue and decrease pressure on nerve roots for improved conductivity and function of down-stream tissues.  Dry needle: Performed TPDN with .30x26mm needle with standard approach as described below to decrease spasm and pain and allow for improved balance of musculature for improved function and decreased symptoms. Therex: Educated on and practiced cervical flexion and SB To maintain  and improve muscle length and allow for improved balance of musculature for long-term symptom relief.   Total time: 60 min.                           Trigger Point Dry Needling - 06/18/19 0001    Consent Given?  Yes    Education Handout Provided  No    Muscles Treated Head and Neck  Suboccipitals;Splenius capitus    Dry Needling Comments  right    Splenius capitus Response  Twitch reponse elicited;Palpable increased muscle length             PT Short Term Goals - 05/15/19 9563       PT SHORT TERM GOAL #1   Title  Patient will demonstrate improved pelvic alignment and balance of musculature surrounding the pelvis to facilitate decreased PFM spasms and decrease pelvic pain.    Baseline  LLE long, hyperlordotic, spasms surrounding pelvis    Time  5    Period  Weeks    Status  New    Target Date  06/19/19      PT SHORT TERM GOAL #2   Title  Patient will demonstrate HEP x1 in the clinic to demonstrate understanding and proper form to allow for further improvement.    Baseline  Pt. lacks knowledge of therepeutic exercises that will decrease SUI and pain.    Time  5    Period  Weeks    Status  New    Target Date  06/19/19      PT SHORT TERM GOAL #3   Title  Patient will demonstrate coordinated diaphragmatic breathing with pelvic tilts to demonstrate improved control of diaphragm and TA, to allow for further strengthening of core musculature and decreased pelvic floor spasm.    Baseline  Diastasis recti, poor coordination of PFM evidenced by incomplete emptying with grade 4 BM.    Time  5    Period  Weeks    Status  New    Target Date  06/19/19        PT Long Term Goals - 05/15/19 0955      PT LONG TERM GOAL #1   Title  Patient will report no episodes of SUI over the course of the prior two weeks to demonstrate improved functional ability.    Baseline  Having SUI with stress and without sensory awareness, needing 1 pantyliner    Time  10    Period  Weeks    Status  New    Target Date  07/24/19      PT LONG TERM GOAL #2   Title  Patient will report no pain with intercourse to demonstrate improved functional ability.    Baseline  Having pain with deep thrusting, not lubricating well, having burning following intercourse    Time  10    Period  Weeks    Status  New    Target Date  07/24/19      PT LONG TERM GOAL #3   Title  Patient will score at or below 65/300 on the PFDI and 16/300 on the PFIQ to demonstrate a clinically meaningful decrease in  disability and distress due to pelvic floor dysfunction.    Baseline  PFDI: 110/300, PFIQ: 33/300    Time  10    Period  Weeks    Status  New    Target Date  07/24/19  Plan - 06/18/19 1239    Clinical Impression Statement  Pt. Responded well to all interventions today, demonstrating improved cervical alignment, decreased spasm and pain, as well as understanding and correct performance of all education and exercises provided today. They will continue to benefit from skilled physical therapy to work toward remaining goals and maximize function as well as decrease likelihood of symptom increase or recurrence.     Personal Factors and Comorbidities  Comorbidity 3+    Comorbidities  IC, IBS, anxiety    Examination-Activity Limitations  Continence;Lift;Squat;Locomotion Level;Stand;Transfers    Examination-Participation Restrictions  Interpersonal Relationship;Yard Work;Cleaning;Shop    Stability/Clinical Decision Making  Unstable/Unpredictable    Rehab Potential  Good    PT Frequency  1x / week    PT Duration  Other (comment)   10 weeks   PT Treatment/Interventions  ADLs/Self Care Home Management;Biofeedback;Moist Heat;Traction;Electrical Stimulation;Balance training;Therapeutic exercise;Therapeutic activities;Functional mobility training;Neuromuscular re-education;Patient/family education;Manual techniques;Dry needling;Scar mobilization;Taping;Spinal Manipulations;Joint Manipulations;Orthotic Fit/Training    PT Next Visit Plan  re-assess neck/R shoulder/treat. DN to LB and Adductors, Educate on urge-suppression technique, hip strengthening, internal?    PT Home Exercise Plan  diaphragmatic breathing, pelvic tilts in seated, hip-flexor hold-relax and stretch, TA in mod-quad, prone chin-tucks, neck flexion and SB stretches    Consulted and Agree with Plan of Care  Patient       Patient will benefit from skilled therapeutic intervention in order to improve the following deficits  and impairments:  Decreased mobility, Increased muscle spasms, Decreased range of motion, Improper body mechanics, Decreased activity tolerance, Decreased coordination, Pain, Postural dysfunction  Visit Diagnosis: Abnormal posture  Other muscle spasm  Chronic midline low back pain, unspecified whether sciatica present     Problem List There are no active problems to display for this patient.  Cleophus MoltKeeli T. Gailes DPT, ATC Cleophus MoltKeeli T Gailes 06/18/2019, 12:40 PM  [place] [facility] MAIN [facility] [address] [place], [place], [address] Phone: [phone]   Fax:  [phone]  Name: Yolanda MangesBrittany M Iglehart MRN: [id number] Date of Birth: Sep 06, 1986

## 2019-06-26 ENCOUNTER — Other Ambulatory Visit: Payer: Self-pay

## 2019-06-26 ENCOUNTER — Ambulatory Visit: Payer: BC Managed Care – PPO

## 2019-06-26 DIAGNOSIS — G8929 Other chronic pain: Secondary | ICD-10-CM | POA: Diagnosis not present

## 2019-06-26 DIAGNOSIS — M62838 Other muscle spasm: Secondary | ICD-10-CM

## 2019-06-26 DIAGNOSIS — R293 Abnormal posture: Secondary | ICD-10-CM

## 2019-06-26 DIAGNOSIS — M545 Low back pain, unspecified: Secondary | ICD-10-CM

## 2019-06-26 NOTE — Patient Instructions (Addendum)
Self External Trigger Point Relief    1) Wash your hands and prop yourself up in a way where you can easily reach the vagina. You may wish to have a small hand-held mirror near by.  2) Use the 2 middle fingers to put gentle pressure on the three external pelvic floor muscles and hold pressure and take deep breaths as you allow the tension to release and discomfort to dissipate   3) Repeat the process for any trigger points you find spending between 3-10 minutes on this every 1-2 days until you do not find any more trigger points or you are told otherwise by your therapist.   Intimate Rose internal trigger point release tool [phone]  Dr. Kathline Magic Premium Prostate Massager   [phone]  Self Internal Trigger Point Relief    1) Wash your hands and prop yourself up in a way where you can easily reach the vagina. You may wish to have a small hand-held mirror near by.  2) lubricate the tool and vaginal opening using a hypoallergenic lubricant such as "slippery-stuff".   3) Slowly and gently insert the tool into the vagina using deep breaths to allow relaxation of the muscles around the tool.  4) Avoiding the "12 o-clock" region near the urethra, gently use the handle of the tool like a lever to press the angled tip of the tool onto the wall of the pelvic floor.   5) Move the tool to different areas of the pelvic floor and feel for areas that are tender called "trigger points". When you find one hold the tool still, applying just enough pressure to elicit mild discomfort and take deep belly breaths until the discomfort subsides or decreases by at least 50%.   6) Repeat the process for any trigger points you find spending between 3-10 minutes on this per night until you do not find any more trigger points or you are told otherwise by your therapist..   Hip Extension:    Lying face down, raise leg just off floor squeezing glutes. Keep leg straight. Hold 1 count. Lower leg to floor.  Do _10__ reps per set, __3_ sets for each leg. Optional: Place small pillow under abdomen if back becomes uncomfortable.    Start on all fours, tuck your hips under slightly and come forward until you are in a straight line. Make sure that your knees, your hips, and your shoulders are in a straight line. Hold for ~ 30 seconds, (or until you feel like you are too tired to hold it correctly). Repeat 2-3 times (or up to ~ 1:30 sec. Total).   As you get stronger, you can lengthen each hold. When you can hold for ~ 1:30 straight, try switching to on your toes instead of knees. You will need to decrease your hold time initially and build back up to the full 1:30.

## 2019-06-26 NOTE — Therapy (Signed)
[facility] [address] [place], [place], [address] Phone: [phone]   Fax:  [phone]  Physical Therapy Treatment  The patient has been informed of current processes in place at Outpatient Rehab to protect patients from Covid-19 exposure including social distancing, schedule modifications, and new cleaning procedures. After discussing their particular risk with a therapist based on the patient's personal risk factors, the patient has decided to proceed with in-person therapy.   Patient Details  Name: Haley MangesBrittany M Devivo MRN: [id number] Date of Birth: [birth year] Referring Provider (PT): Doreene Burkehompson, Annie   Encounter Date: 06/26/2019  PT End of Session - 06/26/19 1635    Visit Number  7    Number of Visits  10    Date for PT Re-Evaluation  07/23/19    Authorization Type  BC[employer]    Authorization - Visit Number  7    Authorization - Number of Visits  10    PT Start Time  1135    PT Stop Time  1235    PT Time Calculation (min)  60 min    Activity Tolerance  Patient tolerated treatment well    Behavior During Therapy  [facility] for tasks assessed/performed       Past Medical History:  Diagnosis Date  . Anemia   . Anxiety    uses albuterol prn for SOB caused by anxiety  . Asthma   . Chronic interstitial cystitis   . Dysrhythmia    tachycardia on B blockers  . GERD (gastroesophageal reflux disease)   . Headache(784.0)   . Hx of gastric ulcer   . Hx of varicella   . IBS (irritable bowel syndrome)   . Other hemoglobinopathies (HCC)   . Tachycardia    take metoprolol 25mg  daily since 33 yo  . Unspecified hemorrhoids without mention of complication     Past Surgical History:  Procedure Laterality Date  . LAPAROSCOPY  2009   exploratory surgery, diagnosed interstitial cystitis    There were no vitals filed for this visit.     Pelvic Floor Physical Therapy Treatment Note  SCREENING  Changes in medications,  allergies, or medical history?: no    SUBJECTIVE  Patient reports: Feeling better overall, no HA since last visit, only having pain at night or when sitting poorly at work. Still having some leakage/smell and dampness.  Precautions:  none  Pain update:  Location of pain: pelvis and R ankle, 6/10 HA Current pain:  3/10  Max pain:  3/10 (after prolonged standing Least pain:  2/10 Nature of pain: dull ache   Patient Goals: Decrease bladder leakage   OBJECTIVE  Changes in:  Posture/Observations:  R C2-3 deviation of spinous process  Pelvic Floor External Exam: Introitus Appears: WNL Skin integrity: mild redness Palpation: TTP through all areas on R>L Cough: intact Prolapse visible?: no Scar mobility: slightly decreased at ~ 8 o'clock  Internal Vaginal Exam: Strength (PERF): 3/5, 5 sec. 2 times Symmetry: similar Palpation: TTP through all PFM internally Prolapse: none   Palpation: Highly TTP to B adductors   INTERVENTIONS THIS SESSION: Manual: Performed internal assessment and TP release to L Anterior PR/PC, OI, and coccygeus to decrease spasm and pain and allow for improved balance of musculature for improved function and decreased symptoms. Educated Pt. On how to perform self external TP release and where to buy tools for self internal TP release. Therex: Educated on and practiced kneeling planks and prone hip EXT to improve strength of muscles  opposing tight musculature to allow reciprocal inhibition to improve balance of musculature surrounding the pelvis and improve overall posture for optimal musculature length-tension relationship and function.    Total time: 60 min.                           PT Short Term Goals - 05/15/19 3557      PT SHORT TERM GOAL #1   Title  Patient will demonstrate improved pelvic alignment and balance of musculature surrounding the pelvis to facilitate decreased PFM spasms and decrease pelvic pain.     Baseline  LLE long, hyperlordotic, spasms surrounding pelvis    Time  5    Period  Weeks    Status  New    Target Date  06/19/19      PT SHORT TERM GOAL #2   Title  Patient will demonstrate HEP x1 in the clinic to demonstrate understanding and proper form to allow for further improvement.    Baseline  Pt. lacks knowledge of therepeutic exercises that will decrease SUI and pain.    Time  5    Period  Weeks    Status  New    Target Date  06/19/19      PT SHORT TERM GOAL #3   Title  Patient will demonstrate coordinated diaphragmatic breathing with pelvic tilts to demonstrate improved control of diaphragm and TA, to allow for further strengthening of core musculature and decreased pelvic floor spasm.    Baseline  Diastasis recti, poor coordination of PFM evidenced by incomplete emptying with grade 4 BM.    Time  5    Period  Weeks    Status  New    Target Date  06/19/19        PT Long Term Goals - 05/15/19 0955      PT LONG TERM GOAL #1   Title  Patient will report no episodes of SUI over the course of the prior two weeks to demonstrate improved functional ability.    Baseline  Having SUI with stress and without sensory awareness, needing 1 pantyliner    Time  10    Period  Weeks    Status  New    Target Date  07/24/19      PT LONG TERM GOAL #2   Title  Patient will report no pain with intercourse to demonstrate improved functional ability.    Baseline  Having pain with deep thrusting, not lubricating well, having burning following intercourse    Time  10    Period  Weeks    Status  New    Target Date  07/24/19      PT LONG TERM GOAL #3   Title  Patient will score at or below 65/300 on the PFDI and 16/300 on the PFIQ to demonstrate a clinically meaningful decrease in disability and distress due to pelvic floor dysfunction.    Baseline  PFDI: 110/300, PFIQ: 33/300    Time  10    Period  Weeks    Status  New    Target Date  07/24/19            Plan - 06/26/19 1637     Clinical Impression Statement  Pt. Responded well to all interventions today, demonstrating improved pain between sessions from prior interventions, decreased spasms internally,and understanding and correct performance of all education and exercises provided today. They will continue to benefit from skilled physical therapy to work  toward remaining goals and maximize function as well as decrease likelihood of symptom increase or recurrence.     Personal Factors and Comorbidities  Comorbidity 3+    Comorbidities  IC, IBS, anxiety    Examination-Activity Limitations  Continence;Lift;Squat;Locomotion Level;Stand;Transfers    Examination-Participation Restrictions  Interpersonal Relationship;Yard Work;Cleaning;Shop    Stability/Clinical Decision Making  Unstable/Unpredictable    Rehab Potential  Good    PT Frequency  1x / week    PT Duration  Other (comment)   10 weeks   PT Treatment/Interventions  ADLs/Self Care Home Management;Biofeedback;Moist Heat;Traction;Electrical Stimulation;Balance training;Therapeutic exercise;Therapeutic activities;Functional mobility training;Neuromuscular re-education;Patient/family education;Manual techniques;Dry needling;Scar mobilization;Taping;Spinal Manipulations;Joint Manipulations;Orthotic Fit/Training    PT Next Visit Plan  DN to LB and Adductors, Educate on self internal TP release if Pt. brings tool/perform TP release, then urge-suppression technique, hip ABD strengthening,    PT Home Exercise Plan  diaphragmatic breathing, pelvic tilts in seated, hip-flexor hold-relax and stretch, TA in mod-quad, prone chin-tucks, neck flexion and SB stretches, prone hip EXT, knee plank, self external TP release    Consulted and Agree with Plan of Care  Patient       Patient will benefit from skilled therapeutic intervention in order to improve the following deficits and impairments:  Decreased mobility, Increased muscle spasms, Decreased range of motion, Improper body  mechanics, Decreased activity tolerance, Decreased coordination, Pain, Postural dysfunction  Visit Diagnosis: Abnormal posture  Other muscle spasm  Chronic midline low back pain, unspecified whether sciatica present     Problem List There are no active problems to display for this patient.  Cleophus Molt DPT, ATC Cleophus Molt 06/26/2019, 4:39 PM  [facility] MAIN [facility] SERVICES [address] [place], [place], [address] Phone: [phone]   Fax:  [phone]  Name: ARYANI GANCARZ MRN: [id number] Date of Birth: 11/12/1985

## 2019-07-03 ENCOUNTER — Ambulatory Visit: Payer: BC Managed Care – PPO

## 2019-07-10 ENCOUNTER — Ambulatory Visit: Payer: BC Managed Care – PPO

## 2019-07-11 ENCOUNTER — Ambulatory Visit: Payer: BC Managed Care – PPO

## 2019-07-11 ENCOUNTER — Other Ambulatory Visit: Payer: Self-pay

## 2019-07-11 DIAGNOSIS — M62838 Other muscle spasm: Secondary | ICD-10-CM

## 2019-07-11 DIAGNOSIS — R293 Abnormal posture: Secondary | ICD-10-CM

## 2019-07-11 DIAGNOSIS — G8929 Other chronic pain: Secondary | ICD-10-CM

## 2019-07-11 DIAGNOSIS — M545 Low back pain, unspecified: Secondary | ICD-10-CM

## 2019-07-11 NOTE — Therapy (Signed)
[facility] [address] [place], [place], [address] Phone: [phone]   Fax:  [phone]  Physical Therapy Treatment  The patient has been informed of current processes in place at Outpatient Rehab to protect patients from Covid-19 exposure including social distancing, schedule modifications, and new cleaning procedures. After discussing their particular risk with a therapist based on the patient's personal risk factors, the patient has decided to proceed with in-person therapy.   Patient Details  Name: Haley MangesBrittany M Graf MRN: [id number] Date of Birth: May 03, 1986 Referring Provider (PT): Doreene Burkehompson, Annie   Encounter Date: 07/11/2019  PT End of Session - 07/11/19 0908    Visit Number  8    Number of Visits  10    Date for PT Re-Evaluation  07/23/19    Authorization Type  BCBS    Authorization - Visit Number  8    Authorization - Number of Visits  10    PT Start Time  0800    PT Stop Time  0855    PT Time Calculation (min)  55 min    Activity Tolerance  Patient tolerated treatment well    Behavior During Therapy  [facility] for tasks assessed/performed       Past Medical History:  Diagnosis Date  . Anemia   . Anxiety    uses albuterol prn for SOB caused by anxiety  . Asthma   . Chronic interstitial cystitis   . Dysrhythmia    tachycardia on B blockers  . GERD (gastroesophageal reflux disease)   . Headache(784.0)   . Hx of gastric ulcer   . Hx of varicella   . IBS (irritable bowel syndrome)   . Other hemoglobinopathies (HCC)   . Tachycardia    take metoprolol 25mg  daily since 33 yo  . Unspecified hemorrhoids without mention of complication     Past Surgical History:  Procedure Laterality Date  . LAPAROSCOPY  2009   exploratory surgery, diagnosed interstitial cystitis    There were no vitals filed for this visit.     Pelvic Floor Physical Therapy Treatment Note  SCREENING  Changes in medications,  allergies, or medical history?: no    SUBJECTIVE  Patient reports: Only having mild LB pain, feels like she needs to be stretched through the low lumbar region.   Precautions:  none  Pain update:  Location of pain:LB Current pain:  3/10  Max pain:  3/10  Least pain:  2/10 Nature of pain: dull ache   Patient Goals: Decrease bladder leakage   OBJECTIVE  Changes in:  Posture/Observations:  R C2-3 deviation of spinous process  Pelvic Floor TTP throughout PFM Slow to release at R posterior PR/PC  Palpation: Highly TTP to B adductors   INTERVENTIONS THIS SESSION: Manual: Performed TP release to R Anterior and posterior PR/PC to decrease spasm and pain and allow for improved balance of musculature for improved function and decreased symptoms.  Therex: Educated on and practiced 3-way wall stretches To maintain and improve muscle length and allow for improved balance of musculature for long-term symptom relief.    Total time: 55 min.                              PT Short Term Goals - 05/15/19 [id number]      PT SHORT TERM GOAL #1   Title  Patient will demonstrate improved pelvic alignment and balance of musculature surrounding the pelvis  to facilitate decreased PFM spasms and decrease pelvic pain.    Baseline  LLE long, hyperlordotic, spasms surrounding pelvis    Time  5    Period  Weeks    Status  New    Target Date  06/19/19      PT SHORT TERM GOAL #2   Title  Patient will demonstrate HEP x1 in the clinic to demonstrate understanding and proper form to allow for further improvement.    Baseline  Pt. lacks knowledge of therepeutic exercises that will decrease SUI and pain.    Time  5    Period  Weeks    Status  New    Target Date  06/19/19      PT SHORT TERM GOAL #3   Title  Patient will demonstrate coordinated diaphragmatic breathing with pelvic tilts to demonstrate improved control of diaphragm and TA, to allow for further strengthening  of core musculature and decreased pelvic floor spasm.    Baseline  Diastasis recti, poor coordination of PFM evidenced by incomplete emptying with grade 4 BM.    Time  5    Period  Weeks    Status  New    Target Date  06/19/19        PT Long Term Goals - 05/15/19 0955      PT LONG TERM GOAL #1   Title  Patient will report no episodes of SUI over the course of the prior two weeks to demonstrate improved functional ability.    Baseline  Having SUI with stress and without sensory awareness, needing 1 pantyliner    Time  10    Period  Weeks    Status  New    Target Date  07/24/19      PT LONG TERM GOAL #2   Title  Patient will report no pain with intercourse to demonstrate improved functional ability.    Baseline  Having pain with deep thrusting, not lubricating well, having burning following intercourse    Time  10    Period  Weeks    Status  New    Target Date  07/24/19      PT LONG TERM GOAL #3   Title  Patient will score at or below 65/300 on the PFDI and 16/300 on the PFIQ to demonstrate a clinically meaningful decrease in disability and distress due to pelvic floor dysfunction.    Baseline  PFDI: 110/300, PFIQ: 33/300    Time  10    Period  Weeks    Status  New    Target Date  07/24/19            Plan - 07/11/19 0908    Clinical Impression Statement  Pt. Responded well to all interventions today, demonstrating decreased PFM spasms and tenderness as well as understanding and correct performance of all education and exercises provided today. They will continue to benefit from skilled physical therapy to work toward remaining goals and maximize function as well as decrease likelihood of symptom increase or recurrence.     Personal Factors and Comorbidities  Comorbidity 3+    Comorbidities  IC, IBS, anxiety    Examination-Activity Limitations  Continence;Lift;Squat;Locomotion Level;Stand;Transfers    Examination-Participation Restrictions  Interpersonal  Relationship;Yard Work;Cleaning;Shop    Stability/Clinical Decision Making  Unstable/Unpredictable    Rehab Potential  Good    PT Frequency  1x / week    PT Duration  Other (comment)   10 weeks   PT Treatment/Interventions  ADLs/Self Care  Home Management;Biofeedback;Moist Heat;Traction;Electrical Stimulation;Balance training;Therapeutic exercise;Therapeutic activities;Functional mobility training;Neuromuscular re-education;Patient/family education;Manual techniques;Dry needling;Scar mobilization;Taping;Spinal Manipulations;Joint Manipulations;Orthotic Fit/Training    PT Next Visit Plan  DN to LB and Adductors, perform TP release, then urge-suppression technique, hip ABD strengthening,    PT Home Exercise Plan  diaphragmatic breathing, pelvic tilts in seated, hip-flexor hold-relax and stretch, TA in mod-quad, prone chin-tucks, neck flexion and SB stretches, prone hip EXT, knee plank, self external TP release    Consulted and Agree with Plan of Care  Patient       Patient will benefit from skilled therapeutic intervention in order to improve the following deficits and impairments:  Decreased mobility, Increased muscle spasms, Decreased range of motion, Improper body mechanics, Decreased activity tolerance, Decreased coordination, Pain, Postural dysfunction  Visit Diagnosis: Abnormal posture  Other muscle spasm  Chronic midline low back pain, unspecified whether sciatica present     Problem List There are no active problems to display for this patient.  Cleophus Molt DPT, ATC Cleophus Molt 07/11/2019, 9:10 AM  [facility] MAIN [facility] SERVICES [address] [place], [place], [address] Phone: [phone]   Fax:  [phone]  Name: TOSHIE DEMELO MRN: [id number] Date of Birth: Jan 03, 1986

## 2019-07-11 NOTE — Patient Instructions (Signed)
3-Way Wall Stretches for Pelvic Floor Lengthening   Bring bottom close to the wall and gently press straighten knees to feel a stretch down the back of you thighs. Hold while taking 5 deep belly breaths and feeling the pelvic floor relax and lower on each inhale.    Let your legs fall to the side to feel a stretch on the inside of your thighs. If the stretch is too intense you can use a pillow to take some of the weight off by wedging it on the outside of your hips. Hold while taking 5 deep belly breaths and feeling the pelvic floor relax and lower on each inhale.    Slide feet down the wall and move hips slightly away from the wall and then let your knees fall to the sides so you feel a stretch on the inside of the thighs near your groin. Hold while taking 5 deep belly breaths and feeling the pelvic floor relax and lower on each inhale.     *Perform each stretch in sequence 3 times, once a day  

## 2019-07-17 ENCOUNTER — Other Ambulatory Visit: Payer: Self-pay

## 2019-07-17 ENCOUNTER — Ambulatory Visit: Payer: BC Managed Care – PPO

## 2019-07-17 DIAGNOSIS — G8929 Other chronic pain: Secondary | ICD-10-CM

## 2019-07-17 DIAGNOSIS — M62838 Other muscle spasm: Secondary | ICD-10-CM

## 2019-07-17 DIAGNOSIS — M545 Low back pain, unspecified: Secondary | ICD-10-CM

## 2019-07-17 DIAGNOSIS — R293 Abnormal posture: Secondary | ICD-10-CM | POA: Diagnosis not present

## 2019-07-17 NOTE — Therapy (Signed)
[place] [facility] [address] , [place], [address] Phone: [phone]   Fax:  [phone]  Physical Therapy Treatment  The patient has been informed of current processes in place at Outpatient Rehab to protect patients from Covid-19 exposure including social distancing, schedule modifications, and new cleaning procedures. After discussing their particular risk with a therapist based on the patient's personal risk factors, the patient has decided to proceed with in-person therapy.   Patient Details  Name: Haley Wong MRN: [id number] Date of Birth: 04-26-1986 Referring Provider (PT): Doreene Burkehompson, Annie   Encounter Date: 07/17/2019  PT End of Session - 07/17/19 1222    Visit Number  9    Number of Visits  10    Date for PT Re-Evaluation  07/23/19    Authorization Type  BCBS    Authorization - Visit Number  9    Authorization - Number of Visits  10    PT Start Time  1131    PT Stop Time  1225    PT Time Calculation (min)  54 min    Activity Tolerance  Patient tolerated treatment well;No increased pain    Behavior During Therapy  WFL for tasks assessed/performed       Past Medical History:  Diagnosis Date  . Anemia   . Anxiety    uses albuterol prn for SOB caused by anxiety  . Asthma   . Chronic interstitial cystitis   . Dysrhythmia    tachycardia on B blockers  . GERD (gastroesophageal reflux disease)   . Headache(784.0)   . Hx of gastric ulcer   . Hx of varicella   . IBS (irritable bowel syndrome)   . Other hemoglobinopathies (HCC)   . Tachycardia    take metoprolol 25mg  daily since 33 yo  . Unspecified hemorrhoids without mention of complication     Past Surgical History:  Procedure Laterality Date  . LAPAROSCOPY  2009   exploratory surgery, diagnosed interstitial cystitis    There were no vitals filed for this visit.     Pelvic Floor Physical Therapy Treatment Note  SCREENING  Changes in  medications, allergies, or medical history?: no    SUBJECTIVE  Patient reports: Only having mild LB pain, feels like she needs to be stretched through the low lumbar region.   Precautions:  none  Pain update:  Location of pain: B hips and LB tightness Current pain:  2/10 (4 in back) Max pain:  2/10 (4 in back) Least pain:  0/10 Nature of pain: dull ache   Patient Goals: Decrease bladder leakage   OBJECTIVE  Changes in:  Posture/Observations:  R C2-3 deviation of spinous process  Pelvic Floor TTP throughout PFM Slow to release at R posterior PR/PC  Palpation: Highly TTP to B Multifidi and Erector Spinae- L4-5 region. Decreased with tenderness following TPDN release.    INTERVENTIONS THIS SESSION: Self-care: Educated on urge suppression. Handout given. Patient verbalized understanding.  Additional patient education for the importance of performing HEP for longer improvements between sessions given this date.  Dry-Needling: TPDN release to B multifidi and erector spinae (L4/5region) to decrease spasm and pain and allow for improved balance of musculature for improved function and decreased symptoms. Manual Therapy: TP release and STM to B multifidi and erector spinae (L4/5 region) to decrease spasm and pain and allow for improved balance of musculature for improved function and decreased symptoms. ThereEx: Forward flexion Lumbar stretch performed and given to HEP this session,  performed for additional lumbar/pelvic to improve mobility of joint and surrounding connective tissue and decrease pressure on nerve roots for improved conductivity and function of down-stream tissues.   Hooklying posterior pelvic tilts performed this session and given to HEP this session to improve coordination of pelvic floor and low back coordination for improved stability to the region.    Total time: 55 min.           [facility] PT Assessment - 07/17/19 0001      Palpation   Palpation  comment  Multifidi and Erector Spinae tenderness present this session                      Trigger Point Dry Needling - 07/17/19 0001    Consent Given?  Yes    Education Handout Provided  No    Muscles Treated Back/Hip  Erector spinae;Lumbar multifidi    Dry Needling Comments  Bilateral    Erector spinae Response  Twitch response elicited    Lumbar multifidi Response  Twitch response elicited             PT Short Term Goals - 05/15/19 6295      PT SHORT TERM GOAL #1   Title  Patient will demonstrate improved pelvic alignment and balance of musculature surrounding the pelvis to facilitate decreased PFM spasms and decrease pelvic pain.    Baseline  LLE long, hyperlordotic, spasms surrounding pelvis    Time  5    Period  Weeks    Status  New    Target Date  06/19/19      PT SHORT TERM GOAL #2   Title  Patient will demonstrate HEP x1 in the clinic to demonstrate understanding and proper form to allow for further improvement.    Baseline  Pt. lacks knowledge of therepeutic exercises that will decrease SUI and pain.    Time  5    Period  Weeks    Status  New    Target Date  06/19/19      PT SHORT TERM GOAL #3   Title  Patient will demonstrate coordinated diaphragmatic breathing with pelvic tilts to demonstrate improved control of diaphragm and TA, to allow for further strengthening of core musculature and decreased pelvic floor spasm.    Baseline  Diastasis recti, poor coordination of PFM evidenced by incomplete emptying with grade 4 BM.    Time  5    Period  Weeks    Status  New    Target Date  06/19/19        PT Long Term Goals - 05/15/19 0955      PT LONG TERM GOAL #1   Title  Patient will report no episodes of SUI over the course of the prior two weeks to demonstrate improved functional ability.    Baseline  Having SUI with stress and without sensory awareness, needing 1 pantyliner    Time  10    Period  Weeks    Status  New    Target Date   07/24/19      PT LONG TERM GOAL #2   Title  Patient will report no pain with intercourse to demonstrate improved functional ability.    Baseline  Having pain with deep thrusting, not lubricating well, having burning following intercourse    Time  10    Period  Weeks    Status  New    Target Date  07/24/19      PT  LONG TERM GOAL #3   Title  Patient will score at or below 65/300 on the PFDI and 16/300 on the PFIQ to demonstrate a clinically meaningful decrease in disability and distress due to pelvic floor dysfunction.    Baseline  PFDI: 110/300, PFIQ: 33/300    Time  10    Period  Weeks    Status  New    Target Date  07/24/19            Plan - 07/17/19 1230    Clinical Impression Statement  Pt. Responded well to all interventions today, demonstrating decreased spasm and pain from 4/10 to 0/10 in low back as well as understanding and correct performance of all education and exercises provided today. They will continue to benefit from skilled physical therapy to work toward remaining goals and maximize function as well as decrease likelihood of symptom increase or recurrence.     Personal Factors and Comorbidities  Comorbidity 3+    Comorbidities  IC, IBS, anxiety    Examination-Activity Limitations  Continence;Lift;Squat;Locomotion Level;Stand;Transfers    Examination-Participation Restrictions  Interpersonal Relationship;Yard Work;Cleaning;Shop    Stability/Clinical Decision Making  Unstable/Unpredictable    Rehab Potential  Good    PT Frequency  1x / week    PT Duration  Other (comment)   10 weeks   PT Treatment/Interventions  ADLs/Self Care Home Management;Biofeedback;Moist Heat;Traction;Electrical Stimulation;Balance training;Therapeutic exercise;Therapeutic activities;Functional mobility training;Neuromuscular re-education;Patient/family education;Manual techniques;Dry needling;Scar mobilization;Taping;Spinal Manipulations;Joint Manipulations;Orthotic Fit/Training    PT Next  Visit Plan  RE-CERT/GOALS DN to Adductors, perform TP release internally, then review urge-suppression technique, hip ABD strengthening,    PT Home Exercise Plan  diaphragmatic breathing, pelvic tilts in seated, hip-flexor hold-relax and stretch, TA in mod-quad, prone chin-tucks, neck flexion and SB stretches, prone hip EXT, knee plank, self external TP release, low-back stretch, urge suppression, posterior pelvic tilt in hook-lying    Consulted and Agree with Plan of Care  Patient       Patient will benefit from skilled therapeutic intervention in order to improve the following deficits and impairments:  Decreased mobility, Increased muscle spasms, Decreased range of motion, Improper body mechanics, Decreased activity tolerance, Decreased coordination, Pain, Postural dysfunction  Visit Diagnosis: Abnormal posture  Other muscle spasm  Chronic midline low back pain, unspecified whether sciatica present     Problem List There are no active problems to display for this patient.  Willa Rough DPT, ATC Willa Rough 07/17/2019, 12:34 PM  [place] MAIN [facility] SERVICES [address] [place], [place], [address] Phone: [phone]   Fax:  [phone]  Name: Haley Wong MRN: [id number] Date of Birth: Mar 08, 1986

## 2019-07-17 NOTE — Patient Instructions (Addendum)
Urge supression technique:  1) Take a deep breath to convince yourself that you are in control and calm the nervous system.  2) Do 5 "quick-flick" kegels (pelvic floor muscle contractions) and re-assess the urge. Repeat another set if urge is still present. 3) Once the urge has decreased, start walking calmly to the the bathroom. Stop and repeat steps 1 and 2 as many times as needed until you can successfully get to the toilet. 4) Only once seated, take a deep breath and allow the pelvic floor muscles to relax and allow for the urine to flow.    Do not be discouraged if you are not successful the first couple times, this is normal and it will take practice but remember that YOU are in control. Start by practicing this at home where you do not have to worry as much if there were to be an accident. Allowing yourself to get rushed or nervous puts the bladder back in control and will not allow the technique to work.    Tuck your hips under, then keep the tuck as you lean forward so you feel a stretch through your low back. Hold for 5 deep breaths, repeat 2-3 times, 1-2 times per day.  Pelvic Tilt With Pelvic Floor (Hook-Lying)        Lie with hips and knees bent. Squeeze pelvic floor and flatten low back while breathing out so that pelvis tilts. Repeat 2 sets 15 times. Do 1-2x times a day.  Put a pillow under your hips in this position when doing this exercise to help decrease pressure in the pelvis when it feels "heavy".

## 2019-07-23 ENCOUNTER — Other Ambulatory Visit: Payer: Self-pay

## 2019-07-23 ENCOUNTER — Ambulatory Visit: Payer: BC Managed Care – PPO | Attending: Certified Nurse Midwife

## 2019-07-23 DIAGNOSIS — G8929 Other chronic pain: Secondary | ICD-10-CM | POA: Insufficient documentation

## 2019-07-23 DIAGNOSIS — M545 Low back pain, unspecified: Secondary | ICD-10-CM

## 2019-07-23 DIAGNOSIS — M62838 Other muscle spasm: Secondary | ICD-10-CM | POA: Diagnosis not present

## 2019-07-23 DIAGNOSIS — R293 Abnormal posture: Secondary | ICD-10-CM | POA: Insufficient documentation

## 2019-07-23 NOTE — Therapy (Signed)
[place] MAIN [facility] SERVICES [address] [place], [place], [address] Phone: [phone]   Fax:  [phone]  Physical Therapy Treatment  Patient Details  Name: Haley Wong MRN: [id number] Date of Birth: September 22, 1986 Referring Provider (PT): Philip Aspen   Encounter Date: 07/23/2019  PT End of Session - 07/23/19 0842    Visit Number  10    Number of Visits  10    Date for PT Re-Evaluation  07/23/19    Authorization Type  BCBS    Authorization - Visit Number  10    Authorization - Number of Visits  10    PT Start Time  [phone]    Activity Tolerance  Patient tolerated treatment well;No increased pain    Behavior During Therapy  WFL for tasks assessed/performed       Past Medical History:  Diagnosis Date  . Anemia   . Anxiety    uses albuterol prn for SOB caused by anxiety  . Asthma   . Chronic interstitial cystitis   . Dysrhythmia    tachycardia on B blockers  . GERD (gastroesophageal reflux disease)   . Headache(784.0)   . Hx of gastric ulcer   . Hx of varicella   . IBS (irritable bowel syndrome)   . Other hemoglobinopathies ([place])   . Tachycardia    take metoprolol '25mg'$  daily since 33 yo  . Unspecified hemorrhoids without mention of complication     Past Surgical History:  Procedure Laterality Date  . LAPAROSCOPY  2009   exploratory surgery, diagnosed interstitial cystitis    There were no vitals filed for this visit.   [place] MAIN [facility] SERVICES [address] [place], [place], [address] Phone: [phone]   Fax:  [phone]  Physical Therapy Treatment  The patient has been informed of current processes in place at Outpatient Rehab to protect patients from Covid-19 exposure including social distancing, schedule modifications, and new cleaning procedures. After discussing their particular risk with a therapist based on the patient's personal risk factors, the patient has  decided to proceed with in-person therapy.   Patient Details  Name: Haley Wong MRN: [id number] Date of Birth: Sep 20, 1986 Referring Provider (PT): Philip Aspen   Encounter Date: 07/23/2019  PT End of Session - 07/23/19 0842    Visit Number  10    Number of Visits  10    Date for PT Re-Evaluation  07/23/19    Authorization Type  BCBS    Authorization - Visit Number  10    Authorization - Number of Visits  10    PT Start Time  [phone]    Activity Tolerance  Patient tolerated treatment well;No increased pain    Behavior During Therapy  WFL for tasks assessed/performed       Past Medical History:  Diagnosis Date  . Anemia   . Anxiety    uses albuterol prn for SOB caused by anxiety  . Asthma   . Chronic interstitial cystitis   . Dysrhythmia    tachycardia on B blockers  . GERD (gastroesophageal reflux disease)   . Headache(784.0)   . Hx of gastric ulcer   . Hx of varicella   . IBS (irritable bowel syndrome)   . Other hemoglobinopathies ([place])   . Tachycardia    take metoprolol '25mg'$  daily since 33 yo  . Unspecified hemorrhoids without mention of complication     Past Surgical History:  Procedure Laterality Date  .  LAPAROSCOPY  2009   exploratory surgery, diagnosed interstitial cystitis    There were no vitals filed for this visit.     Pelvic Floor Physical Therapy Treatment Note  SCREENING  Changes in medications, allergies, or medical history?: no    SUBJECTIVE  Patient reports: She has been doing really well, not having pain and only had SUI one time over the past week while riding four-wheelers.   Precautions:  none  Pain update:  Location of pain: B hips and LB tightness Current pain:  0/10  Max pain:  2/10  Least pain:  0/10 Nature of pain: dull ache   Patient Goals: Decrease bladder leakage   OBJECTIVE  Changes in:  Posture/Observations:  R C2-3 deviation of spinous process  Pelvic Floor N/A  Palpation: Highly TTP to B  adductors   INTERVENTIONS THIS SESSION: Dry-Needling: TPDN release to B adductors to decrease spasm and pain and allow for improved balance of musculature for improved function and decreased symptoms. Manual Therapy: TP release and STM to B adductors to decrease spasm and pain and allow for improved balance of musculature for improved function and decreased symptoms.   Total time: 65 min.              Trigger Point Dry Needling - 07/23/19 0001    Consent Given?  Yes    Education Handout Provided  No    Muscles Treated Lower Quadrant  Adductor longus/brevis/magnus    Adductor Response  Twitch response elicited;Palpable increased muscle length             PT Short Term Goals - 07/23/19 0844      PT SHORT TERM GOAL #1   Title  Patient will demonstrate improved pelvic alignment and balance of musculature surrounding the pelvis to facilitate decreased PFM spasms and decrease pelvic pain.    Baseline  LLE long, hyperlordotic, spasms surrounding pelvis    Time  5    Period  Weeks    Status  Achieved    Target Date  06/19/19      PT SHORT TERM GOAL #2   Title  Patient will demonstrate HEP x1 in the clinic to demonstrate understanding and proper form to allow for further improvement.    Baseline  Pt. lacks knowledge of therepeutic exercises that will decrease SUI and pain.    Time  5    Period  Weeks    Status  Achieved    Target Date  06/19/19      PT SHORT TERM GOAL #3   Title  Patient will demonstrate coordinated diaphragmatic breathing with pelvic tilts to demonstrate improved control of diaphragm and TA, to allow for further strengthening of core musculature and decreased pelvic floor spasm.    Baseline  Diastasis recti, poor coordination of PFM evidenced by incomplete emptying with grade 4 BM.    Time  5    Period  Weeks    Status  Achieved    Target Date  06/19/19        PT Long Term Goals - 07/23/19 0845      PT LONG TERM GOAL #1   Title  Patient  will report no episodes of SUI over the course of the prior two weeks to demonstrate improved functional ability.    Baseline  Having SUI with stress and without sensory awareness, needing 1 pantyliner    Time  10    Period  Weeks    Status  On-going  Target Date  10/01/19      PT LONG TERM GOAL #2   Title  Patient will report no pain with intercourse to demonstrate improved functional ability.    Baseline  Having pain with deep thrusting, not lubricating well, having burning following intercourse. As of 10/6: 70% improved    Time  10    Period  Weeks    Status  On-going    Target Date  10/01/19      PT LONG TERM GOAL #3   Title  Patient will score at or below 65/300 on the PFDI and 16/300 on the PFIQ to demonstrate a clinically meaningful decrease in disability and distress due to pelvic floor dysfunction.    Baseline  PFDI: 110/300, PFIQ: 33/300 As of 10/6: PFDI: 44/300, PFIQ: 62/300    Time  10    Period  Weeks    Status  On-going    Target Date  10/01/19            Plan - 07/23/19 0843    Clinical Impression Statement  Pt. has met or made progress toward all goals and continues to progress toward her remaining goal of decreased SUI, only having one episode of leakage over the past week while on her four-wheeler. She continues to demonstrate decreased balance of musculature surrounding the pelvis and would likely have returned Sx. if we do not improve strength and balance before discharging. We will continue for 10 more weeks at 1 time per week to continue to progress toward her remaining goals with a focus on releaasing remaining PFM spasm and improving balance of musculature surrounding pelvis and spine to allow for long-term relief and prevention of return of Sx.    Personal Factors and Comorbidities  Comorbidity 3+    Comorbidities  IC, IBS, anxiety    Examination-Activity Limitations  Continence;Lift;Squat;Locomotion Level;Stand;Transfers    Examination-Participation  Restrictions  Interpersonal Relationship;Yard Work;Cleaning;Shop    Stability/Clinical Decision Making  Unstable/Unpredictable    Rehab Potential  Good    PT Frequency  1x / week    PT Duration  Other (comment)    PT Treatment/Interventions  ADLs/Self Care Home Management;Biofeedback;Moist Heat;Traction;Electrical Stimulation;Balance training;Therapeutic exercise;Therapeutic activities;Functional mobility training;Neuromuscular re-education;Patient/family education;Manual techniques;Dry needling;Scar mobilization;Taping;Spinal Manipulations;Joint Manipulations;Orthotic Fit/Training    PT Next Visit Plan  perform TP release internally, then review urge-suppression technique, hip ABD strengthening,    PT Home Exercise Plan  diaphragmatic breathing, pelvic tilts in seated, hip-flexor hold-relax and stretch, TA in mod-quad, prone chin-tucks, neck flexion and SB stretches, prone hip EXT, knee plank, self external TP release, low-back stretch, urge suppression, posterior pelvic tilt in hook-lying    Consulted and Agree with Plan of Care  Patient       Patient will benefit from skilled therapeutic intervention in order to improve the following deficits and impairments:  Decreased mobility, Increased muscle spasms, Decreased range of motion, Improper body mechanics, Decreased activity tolerance, Decreased coordination, Pain, Postural dysfunction  Visit Diagnosis: Abnormal posture  Other muscle spasm  Chronic midline low back pain, unspecified whether sciatica present     Problem List There are no active problems to display for this patient.   Willa Rough 07/23/2019, 10:08 AM  [place] MAIN [facility] SERVICES [address] [place], [place], [address] Phone: [phone]   Fax:  [phone]  Name: Haley Wong MRN: [id number] Date of Birth: Jul 02, 1986

## 2020-01-16 DIAGNOSIS — N939 Abnormal uterine and vaginal bleeding, unspecified: Secondary | ICD-10-CM | POA: Diagnosis not present

## 2020-01-16 DIAGNOSIS — N946 Dysmenorrhea, unspecified: Secondary | ICD-10-CM | POA: Diagnosis not present

## 2020-01-16 DIAGNOSIS — Z01419 Encounter for gynecological examination (general) (routine) without abnormal findings: Secondary | ICD-10-CM | POA: Diagnosis not present

## 2020-01-16 DIAGNOSIS — Z6835 Body mass index (BMI) 35.0-35.9, adult: Secondary | ICD-10-CM | POA: Diagnosis not present

## 2020-01-16 DIAGNOSIS — Z1151 Encounter for screening for human papillomavirus (HPV): Secondary | ICD-10-CM | POA: Diagnosis not present

## 2020-01-28 DIAGNOSIS — N946 Dysmenorrhea, unspecified: Secondary | ICD-10-CM | POA: Diagnosis not present

## 2020-02-10 DIAGNOSIS — G43011 Migraine without aura, intractable, with status migrainosus: Secondary | ICD-10-CM | POA: Diagnosis not present

## 2020-02-10 DIAGNOSIS — Z87898 Personal history of other specified conditions: Secondary | ICD-10-CM | POA: Diagnosis not present

## 2020-02-11 NOTE — Patient Instructions (Addendum)
YOU ARE SCHEDULED FOR A COVID TEST ___5-10-21______@____________ . THIS TEST MUST BE DONE BEFORE SURGERY. GO TO  [address], [place], [address] AND REMAIN IN YOUR CAR, THIS IS A DRIVE UP TEST. ONCE YOUR COVID TEST IS DONE PLEASE FOLLOW ALL THE QUARANTINE  INSTRUCTIONS GIVEN IN YOUR HANDOUT.      Your procedure is scheduled on  02-27-20   Report to [place]. M.   Call this number if you have problems the morning of surgery  :[phone].   OUR ADDRESS IS [place].  WE ARE LOCATED IN [facility].                                     REMEMBER:  DO NOT EAT FOOD OR DRINK LIQUIDS AFTER MIDNIGHT .  YOU MAY  BRUSH YOUR TEETH MORNING OF SURGERY AND RINSE YOUR MOUTH OUT, NO CHEWING GUM CANDY OR MINTS.   TAKE THESE MEDICATIONS MORNING OF SURGERY WITH A SIP OF WATER:  __inhaler if needed and bring________________________________  IF YOU ARE SPENDING THE NIGHT AFTER SURGERY PLEASE BRING ALL YOUR PRESCRIPTION MEDICATIONS IN THEIR ORIGINAL BOTTLES. 1 VISITOR IS ALLOWED IN WAITING ROOM ONLY DAY OF SURGERY. NO VISITOR MAY SPEND THE NIGHT. VISITOR ARE ALLOWED TO STAY UNTIL 800 PM.                                    DO NOT WEAR JEWERLY, MAKE UP, OR NAIL POLISH ON FINGERNAILS. DO NOT WEAR LOTIONS, POWDERS, PERFUMES OR DEODORANT. DO NOT SHAVE FOR 24 HOURS PRIOR TO DAY OF SURGERY.  CONTACTS, GLASSES, OR DENTURES MAY NOT BE WORN TO SURGERY.                                    [facility] IS NOT RESPONSIBLE  FOR ANY BELONGINGS.                                                                    Marland Kitchen                                  [facility] - Preparing for Surgery Before surgery, you can play an important role.  Because skin is not sterile, your skin needs to be as free of germs as possible.  You can reduce the number of germs on your skin by washing with CHG (chlorahexidine gluconate) soap before surgery.  CHG is an antiseptic cleaner  which kills germs and bonds with the skin to continue killing germs even after washing. Please DO NOT use if you have an allergy to CHG or antibacterial soaps.  If your skin becomes reddened/irritated stop using the CHG and inform your nurse when you arrive at Short Stay. Do not shave (including legs and underarms) for at least 48 hours prior to the first CHG shower.  You may shave your face/neck.  Please follow these instructions carefully:  1.  Shower with CHG Soap the night before surgery and the  morning of Surgery.  2.  If you choose to wash your hair, wash your hair first as usual with your  normal  shampoo.  3.  After you shampoo, rinse your hair and body thoroughly to remove the  shampoo.                           4.  Use CHG as you would any other liquid soap.  You can apply chg directly  to the skin and wash                       Gently with a scrungie or clean washcloth.  5.  Apply the CHG Soap to your body ONLY FROM THE NECK DOWN.   Do not use on face/ open                           Wound or open sores. Avoid contact with eyes, ears mouth and genitals (private parts).                       Wash face,  Genitals (private parts) with your normal soap.             6.  Wash thoroughly, paying special attention to the area where your surgery  will be performed.  7.  Thoroughly rinse your body with warm water from the neck down.  8.  DO NOT shower/wash with your normal soap after using and rinsing off  the CHG Soap.                9.  Pat yourself dry with a clean towel.            10.  Wear clean pajamas.            11.  Place clean sheets on your bed the night of your first shower and do not  sleep with pets. Day of Surgery : Do not apply any lotions/deodorants the morning of surgery.  Please wear clean clothes to the hospital/surgery center.  FAILURE TO FOLLOW THESE INSTRUCTIONS MAY RESULT IN THE CANCELLATION OF YOUR SURGERY PATIENT SIGNATURE_________________________________  NURSE  SIGNATURE__________________________________  ________________________________________________________________________

## 2020-02-12 ENCOUNTER — Encounter (HOSPITAL_COMMUNITY)
Admission: RE | Admit: 2020-02-12 | Discharge: 2020-02-12 | Disposition: A | Discharge status: Home / Self Care | Payer: BC Managed Care – PPO | Source: Ambulatory Visit | Attending: Obstetrics and Gynecology | Admitting: Obstetrics and Gynecology

## 2020-02-12 ENCOUNTER — Other Ambulatory Visit: Payer: Self-pay | Admitting: Obstetrics and Gynecology

## 2020-02-12 NOTE — Progress Notes (Signed)
Second call . Pt. States she will have to reschedule preop call due to a client at work . Chart give to [facility] to reschedule.

## 2020-02-12 NOTE — Progress Notes (Signed)
Phone call preop call made no ansewr LVM and also called office to see if they had any additional numbers. They did not. Office states pt. Is trying to work things out to be able to have surgery on the date of 02-27-20

## 2020-02-13 ENCOUNTER — Encounter (HOSPITAL_COMMUNITY): Payer: BC Managed Care – PPO

## 2020-02-19 ENCOUNTER — Other Ambulatory Visit: Payer: Self-pay | Admitting: Obstetrics and Gynecology

## 2020-02-19 ENCOUNTER — Encounter (HOSPITAL_COMMUNITY)
Admission: RE | Admit: 2020-02-19 | Discharge: 2020-02-19 | Disposition: A | Discharge status: Home / Self Care | Payer: BC Managed Care – PPO | Source: Ambulatory Visit | Attending: Obstetrics and Gynecology | Admitting: Obstetrics and Gynecology

## 2020-02-19 ENCOUNTER — Other Ambulatory Visit: Payer: Self-pay

## 2020-02-19 ENCOUNTER — Encounter (HOSPITAL_COMMUNITY): Payer: Self-pay

## 2020-02-19 DIAGNOSIS — Z01812 Encounter for preprocedural laboratory examination: Secondary | ICD-10-CM | POA: Diagnosis present

## 2020-02-19 HISTORY — DX: Family history of other specified conditions: Z84.89

## 2020-02-19 HISTORY — DX: Cardiac murmur, unspecified: R01.1

## 2020-02-19 LAB — CBC
HCT: 40.3 % (ref 36.0–46.0)
Hemoglobin: 13.2 g/dL (ref 12.0–15.0)
MCH: 29.9 pg (ref 26.0–34.0)
MCHC: 32.8 g/dL (ref 30.0–36.0)
MCV: 91.4 fL (ref 80.0–100.0)
Platelets: 298 10*3/uL (ref 150–400)
RBC: 4.41 MIL/uL (ref 3.87–5.11)
RDW: 12.3 % (ref 11.5–15.5)
WBC: 8.3 10*3/uL (ref 4.0–10.5)
nRBC: 0 % (ref 0.0–0.2)

## 2020-02-19 LAB — ABO/RH: ABO/RH(D): A POS

## 2020-02-19 NOTE — Patient Instructions (Addendum)
YOU ARE SCHEDULED FOR A COVID TEST ___5-10-21______@___11 :00_________. THIS TEST MUST BE DONE BEFORE SURGERY. GO TO  [address], [place], [address] AND REMAIN IN YOUR CAR, THIS IS A DRIVE UP TEST. ONCE YOUR COVID TEST IS DONE PLEASE FOLLOW ALL THE QUARANTINE  INSTRUCTIONS GIVEN IN YOUR HANDOUT.      Your procedure is scheduled on  02-27-20   Report to [facility] AT     11:27  A. M.   Call this number if you have problems the morning of surgery  [phone].   OUR ADDRESS IS [address].  WE ARE LOCATED IN [facility].                                     REMEMBER:  DO NOT EAT FOOD OR DRINK LIQUIDS AFTER MIDNIGHT .  YOU MAY  BRUSH YOUR TEETH MORNING OF SURGERY AND RINSE YOUR MOUTH OUT, NO CHEWING GUM CANDY OR MINTS.   TAKE THESE MEDICATIONS MORNING OF SURGERY WITH A SIP OF WATER:  __inhaler if needed and bring________________________________  IF YOU ARE SPENDING THE NIGHT AFTER SURGERY PLEASE BRING ALL YOUR PRESCRIPTION MEDICATIONS IN THEIR ORIGINAL BOTTLES. 1 VISITOR IS ALLOWED IN WAITING ROOM ONLY DAY OF SURGERY. NO VISITOR MAY SPEND THE NIGHT. VISITOR ARE ALLOWED TO STAY UNTIL 800 PM.                                    DO NOT WEAR JEWERLY, MAKE UP, OR NAIL POLISH ON FINGERNAILS. DO NOT WEAR LOTIONS, POWDERS, PERFUMES OR DEODORANT. DO NOT SHAVE FOR 24 HOURS PRIOR TO DAY OF SURGERY.  CONTACTS, GLASSES, OR DENTURES MAY NOT BE WORN TO SURGERY.                                    [facility] IS NOT RESPONSIBLE  FOR ANY BELONGINGS.                                                                    Marland Kitchen                                  [facility] - Preparing for Surgery Before surgery, you can play an important role.  Because skin is not sterile, your skin needs to be as free of germs as possible.  You can reduce the number of germs on your skin by washing with CHG (chlorahexidine gluconate) soap before surgery.  CHG is an antiseptic cleaner  which kills germs and bonds with the skin to continue killing germs even after washing. Please DO NOT use if you have an allergy to CHG or antibacterial soaps.  If your skin becomes reddened/irritated stop using the CHG and inform your nurse when you arrive at Short Stay. Do not shave (including legs and underarms) for at least 48 hours prior to the first CHG shower.  You may shave your face/neck.  Please follow these instructions carefully:  1.  Shower with CHG Soap the night before surgery and the  morning of Surgery.  2.  If you choose to wash your hair, wash your hair first as usual with your  normal  shampoo.  3.  After you shampoo, rinse your hair and body thoroughly to remove the  shampoo.                           4.  Use CHG as you would any other liquid soap.  You can apply chg directly  to the skin and wash                       Gently with a scrungie or clean washcloth.  5.  Apply the CHG Soap to your body ONLY FROM THE NECK DOWN.   Do not use on face/ open                           Wound or open sores. Avoid contact with eyes, ears mouth and genitals (private parts).                       Wash face,  Genitals (private parts) with your normal soap.             6.  Wash thoroughly, paying special attention to the area where your surgery  will be performed.  7.  Thoroughly rinse your body with warm water from the neck down.  8.  DO NOT shower/wash with your normal soap after using and rinsing off  the CHG Soap.                9.  Pat yourself dry with a clean towel.            10.  Wear clean pajamas.            11.  Place clean sheets on your bed the night of your first shower and do not  sleep with pets. Day of Surgery : Do not apply any lotions/deodorants the morning of surgery.  Please wear clean clothes to the hospital/surgery center.  FAILURE TO FOLLOW THESE INSTRUCTIONS MAY RESULT IN THE CANCELLATION OF YOUR SURGERY PATIENT SIGNATURE_________________________________  NURSE  SIGNATURE__________________________________  ________________________________________________________________________                                            WHAT IS A BLOOD TRANSFUSION? Blood Transfusion Information  A transfusion is the replacement of blood or some of its parts. Blood is made up of multiple cells which provide different functions.  Red blood cells carry oxygen and are used for blood loss replacement.  White blood cells fight against infection.  Platelets control bleeding.  Plasma helps clot blood.  Other blood products are available for specialized needs, such as hemophilia or other clotting disorders. BEFORE THE TRANSFUSION  Who gives blood for transfusions?   Healthy volunteers who are fully evaluated to make sure their blood is safe. This is blood bank blood. Transfusion therapy is the safest it has ever been in the practice of medicine. Before blood is taken from a donor, a complete history is taken to make sure that person has no history of diseases nor engages in  risky social behavior (examples are intravenous drug use or sexual activity with multiple partners). The donor's travel history is screened to minimize risk of transmitting infections, such as malaria. The donated blood is tested for signs of infectious diseases, such as HIV and hepatitis. The blood is then tested to be sure it is compatible with you in order to minimize the chance of a transfusion reaction. If you or a relative donates blood, this is often done in anticipation of surgery and is not appropriate for emergency situations. It takes many days to process the donated blood. RISKS AND COMPLICATIONS Although transfusion therapy is very safe and saves many lives, the main dangers of transfusion include:   Getting an infectious disease.  Developing a transfusion reaction. This is an allergic reaction to something in the blood you were given. Every precaution is taken to prevent this. The  decision to have a blood transfusion has been considered carefully by your caregiver before blood is given. Blood is not given unless the benefits outweigh the risks. AFTER THE TRANSFUSION  Right after receiving a blood transfusion, you will usually feel much better and more energetic. This is especially true if your red blood cells have gotten low (anemic). The transfusion raises the level of the red blood cells which carry oxygen, and this usually causes an energy increase.  The nurse administering the transfusion will monitor you carefully for complications. HOME CARE INSTRUCTIONS  No special instructions are needed after a transfusion. You may find your energy is better. Speak with your caregiver about any limitations on activity for underlying diseases you may have. SEEK MEDICAL CARE IF:   Your condition is not improving after your transfusion.  You develop redness or irritation at the intravenous (IV) site. SEEK IMMEDIATE MEDICAL CARE IF:  Any of the following symptoms occur over the next 12 hours:  Shaking chills.  You have a temperature by mouth above 102 F (38.9 C), not controlled by medicine.  Chest, back, or muscle pain.  People around you feel you are not acting correctly or are confused.  Shortness of breath or difficulty breathing.  Dizziness and fainting.  You get a rash or develop hives.  You have a decrease in urine output.  Your urine turns a dark color or changes to pink, red, or brown. Any of the following symptoms occur over the next 10 days:  You have a temperature by mouth above 102 F (38.9 C), not controlled by medicine.  Shortness of breath.  Weakness after normal activity.  The white part of the eye turns yellow (jaundice).  You have a decrease in the amount of urine or are urinating less often.  Your urine turns a dark color or changes to pink, red, or brown. Document Released: 09/30/2000 Document Revised: 12/26/2011 Document Reviewed:  05/19/2008 [facility] Patient Information 2014 [place], [place].  _______________________________________________________________________

## 2020-02-20 ENCOUNTER — Other Ambulatory Visit: Payer: Self-pay | Admitting: Obstetrics and Gynecology

## 2020-02-24 ENCOUNTER — Other Ambulatory Visit (HOSPITAL_COMMUNITY)
Admission: RE | Admit: 2020-02-24 | Discharge: 2020-02-24 | Disposition: A | Discharge status: Home / Self Care | Payer: BC Managed Care – PPO | Source: Ambulatory Visit | Attending: Obstetrics and Gynecology | Admitting: Obstetrics and Gynecology

## 2020-02-24 DIAGNOSIS — Z01812 Encounter for preprocedural laboratory examination: Secondary | ICD-10-CM | POA: Insufficient documentation

## 2020-02-24 DIAGNOSIS — Z20822 Contact with and (suspected) exposure to covid-19: Secondary | ICD-10-CM | POA: Diagnosis not present

## 2020-02-24 LAB — SARS CORONAVIRUS 2 (TAT 6-24 HRS): SARS Coronavirus 2: NEGATIVE

## 2020-02-24 NOTE — Progress Notes (Signed)
Chart received from [place] zanetto pa ok to proceed

## 2020-02-26 NOTE — H&P (Signed)
Haley Wong is an 34 y.o. female. 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  Pertinent Gynecological History: Menses: with severe dysmenorrhea Bleeding: dysfunctional uterine bleeding Contraception: none DES exposure: denies Blood transfusions: none Sexually transmitted diseases: no past history Previous GYN Procedures: DNC  Last mammogram: na Date: na Last pap: normal Date: 2020 OB History: G2, P2   Menstrual History: Menarche age: 43 Patient's last menstrual period was 02/19/2020.    Past Medical History:  Diagnosis Date  . Anemia   . Anxiety   . Asthma    excercised induced  . Chronic interstitial cystitis   . Dysrhythmia    tachycardia on B blockers . No longer takes has a resting   HR 80-90's   . Family history of adverse reaction to anesthesia    Dad was combative before  . GERD (gastroesophageal reflux disease)   . Headache(784.0)   . Heart murmur   . Hx of gastric ulcer   . Hx of varicella   . IBS (irritable bowel syndrome)   . Other hemoglobinopathies (HCC)   . Tachycardia    take metoprolol 25mg  daily since 34 yo  . Unspecified hemorrhoids without mention of complication     Past Surgical History:  Procedure Laterality Date  . COLONOSCOPY    . LAPAROSCOPY  2009   exploratory surgery, diagnosed interstitial cystitis    Family History  Problem Relation Age of Onset  . Hypothyroidism Maternal Aunt   . Diabetes Maternal Grandmother   . Kidney disease Maternal Grandmother   . Hypothyroidism Maternal Grandmother   . Diabetes Maternal Grandfather   . Hypertension Maternal Grandfather   . Heart attack Maternal Grandfather   . Hypertension Paternal Grandfather   . Hypothyroidism Maternal Aunt   . Hypothyroidism Maternal Aunt   . Hypothyroidism Maternal Aunt     Social History:  reports that she has never smoked. She has never used smokeless tobacco. She reports current alcohol use. She reports that she does not use  drugs.  Allergies:  Allergies  Allergen Reactions  . Amoxicillin Anaphylaxis  . Hydrocodone-Homatropine Itching and Other (See Comments)    Caused restlessness  . Latex Rash  . Other Rash and Other (See Comments)    Avoids due to gastritis on 06/09/14 EGD  . Pineapple Other (See Comments)    Throat and tongue swells Throat and tongue swells    No medications prior to admission.    Review of Systems  Constitutional: Negative.   All other systems reviewed and are negative.   Last menstrual period 02/19/2020. Physical Exam  Nursing note and vitals reviewed. Constitutional: She is oriented to person, place, and time. She appears well-developed and well-nourished.  HENT:  Head: Normocephalic and atraumatic.  Cardiovascular: Normal rate and regular rhythm.  Respiratory: Effort normal and breath sounds normal.  GI: Soft. Bowel sounds are normal.  Genitourinary:    Vagina and uterus normal.   Musculoskeletal:        General: Normal range of motion.     Cervical back: Normal range of motion and neck supple.  Neurological: She is alert and oriented to person, place, and time. She has normal reflexes.  Skin: Skin is warm and dry.  Psychiatric: She has a normal mood and affect.    No results found for this or any previous visit (from the past 24 hour(s)).  No results found.  Assessment/Plan: Severe refractory secondary dysmenorrhea DAvinci TLH, bilateral salpingectomy Surgical risks discussed. Consent done.  Haley Wong J  02/26/2020, 8:53 PM

## 2020-02-27 ENCOUNTER — Ambulatory Visit (HOSPITAL_BASED_OUTPATIENT_CLINIC_OR_DEPARTMENT_OTHER): Payer: BC Managed Care – PPO | Admitting: Physician Assistant

## 2020-02-27 ENCOUNTER — Ambulatory Visit (HOSPITAL_BASED_OUTPATIENT_CLINIC_OR_DEPARTMENT_OTHER)
Admission: RE | Admit: 2020-02-27 | Discharge: 2020-02-28 | Disposition: A | Discharge status: Home / Self Care | Payer: BC Managed Care – PPO | Source: Ambulatory Visit | Attending: Obstetrics and Gynecology | Admitting: Obstetrics and Gynecology

## 2020-02-27 ENCOUNTER — Ambulatory Visit (HOSPITAL_BASED_OUTPATIENT_CLINIC_OR_DEPARTMENT_OTHER): Payer: BC Managed Care – PPO | Admitting: Anesthesiology

## 2020-02-27 ENCOUNTER — Encounter (HOSPITAL_BASED_OUTPATIENT_CLINIC_OR_DEPARTMENT_OTHER)
Admission: RE | Disposition: A | Discharge status: Home / Self Care | Payer: Self-pay | Source: Ambulatory Visit | Attending: Obstetrics and Gynecology

## 2020-02-27 ENCOUNTER — Other Ambulatory Visit: Payer: Self-pay

## 2020-02-27 ENCOUNTER — Encounter (HOSPITAL_BASED_OUTPATIENT_CLINIC_OR_DEPARTMENT_OTHER): Payer: Self-pay | Admitting: Obstetrics and Gynecology

## 2020-02-27 DIAGNOSIS — N815 Vaginal enterocele: Secondary | ICD-10-CM | POA: Insufficient documentation

## 2020-02-27 DIAGNOSIS — N838 Other noninflammatory disorders of ovary, fallopian tube and broad ligament: Secondary | ICD-10-CM | POA: Insufficient documentation

## 2020-02-27 DIAGNOSIS — N938 Other specified abnormal uterine and vaginal bleeding: Secondary | ICD-10-CM | POA: Diagnosis not present

## 2020-02-27 DIAGNOSIS — R000 Tachycardia, unspecified: Secondary | ICD-10-CM | POA: Insufficient documentation

## 2020-02-27 DIAGNOSIS — N945 Secondary dysmenorrhea: Secondary | ICD-10-CM | POA: Diagnosis present

## 2020-02-27 DIAGNOSIS — N946 Dysmenorrhea, unspecified: Secondary | ICD-10-CM | POA: Diagnosis present

## 2020-02-27 DIAGNOSIS — N736 Female pelvic peritoneal adhesions (postinfective): Secondary | ICD-10-CM | POA: Diagnosis not present

## 2020-02-27 HISTORY — PX: ROBOTIC ASSISTED LAPAROSCOPIC HYSTERECTOMY AND SALPINGECTOMY: SHX6379

## 2020-02-27 LAB — POCT PREGNANCY, URINE: Preg Test, Ur: NEGATIVE

## 2020-02-27 LAB — TYPE AND SCREEN
ABO/RH(D): A POS
Antibody Screen: NEGATIVE

## 2020-02-27 SURGERY — XI ROBOTIC ASSISTED LAPAROSCOPIC HYSTERECTOMY AND SALPINGECTOMY
Anesthesia: General | Site: Abdomen | Laterality: Bilateral

## 2020-02-27 MED ORDER — KETOROLAC TROMETHAMINE 30 MG/ML IJ SOLN
INTRAMUSCULAR | Status: DC | PRN
Start: 2020-02-27 — End: 2020-02-27
  Administered 2020-02-27: 30 mg via INTRAVENOUS

## 2020-02-27 MED ORDER — ROCURONIUM BROMIDE 10 MG/ML (PF) SYRINGE
PREFILLED_SYRINGE | INTRAVENOUS | Status: DC | PRN
  Administered 2020-02-27: 10 mg via INTRAVENOUS
  Administered 2020-02-27: 60 mg via INTRAVENOUS

## 2020-02-27 MED ORDER — OXYCODONE HCL 5 MG PO TABS: 5.0000 mg | ORAL_TABLET | Freq: Once | ORAL | Status: DC | PRN

## 2020-02-27 MED ORDER — ONDANSETRON HCL 4 MG/2ML IJ SOLN
INTRAMUSCULAR | Status: AC
  Filled 2020-02-27: qty 2

## 2020-02-27 MED ORDER — LIDOCAINE 2% (20 MG/ML) 5 ML SYRINGE
INTRAMUSCULAR | Status: DC | PRN
  Administered 2020-02-27: 100 mg via INTRAVENOUS

## 2020-02-27 MED ORDER — ONDANSETRON HCL 4 MG/2ML IJ SOLN
4.0000 mg | Freq: Once | INTRAMUSCULAR | Status: AC | PRN
  Administered 2020-02-27: 4 mg via INTRAVENOUS

## 2020-02-27 MED ORDER — LIDOCAINE 2% (20 MG/ML) 5 ML SYRINGE
INTRAMUSCULAR | Status: AC
  Filled 2020-02-27: qty 5

## 2020-02-27 MED ORDER — KETOROLAC TROMETHAMINE 30 MG/ML IJ SOLN
30.0000 mg | Freq: Once | INTRAMUSCULAR | Status: AC | PRN
  Administered 2020-02-27: 30 mg via INTRAVENOUS

## 2020-02-27 MED ORDER — FENTANYL CITRATE (PF) 100 MCG/2ML IJ SOLN
INTRAMUSCULAR | Status: DC | PRN
  Administered 2020-02-27: 50 ug via INTRAVENOUS
  Administered 2020-02-27 (×2): 25 ug via INTRAVENOUS
  Administered 2020-02-27 (×2): 50 ug via INTRAVENOUS

## 2020-02-27 MED ORDER — KETOROLAC TROMETHAMINE 30 MG/ML IJ SOLN
INTRAMUSCULAR | Status: AC
  Filled 2020-02-27: qty 1

## 2020-02-27 MED ORDER — PROPOFOL 10 MG/ML IV BOLUS
INTRAVENOUS | Status: DC | PRN
  Administered 2020-02-27: 200 mg via INTRAVENOUS

## 2020-02-27 MED ORDER — MIDAZOLAM HCL 2 MG/2ML IJ SOLN
INTRAMUSCULAR | Status: AC
  Filled 2020-02-27: qty 2

## 2020-02-27 MED ORDER — SCOPOLAMINE 1 MG/3DAYS TD PT72
MEDICATED_PATCH | TRANSDERMAL | Status: AC
  Filled 2020-02-27: qty 1

## 2020-02-27 MED ORDER — PROPOFOL 10 MG/ML IV BOLUS
INTRAVENOUS | Status: AC
  Filled 2020-02-27: qty 20

## 2020-02-27 MED ORDER — SCOPOLAMINE 1 MG/3DAYS TD PT72
1.0000 | MEDICATED_PATCH | TRANSDERMAL | Status: DC
  Administered 2020-02-27: 1.5 mg via TRANSDERMAL

## 2020-02-27 MED ORDER — METOPROLOL TARTRATE 25 MG PO TABS: 25.0000 mg | ORAL_TABLET | Freq: Every day | ORAL | Status: DC

## 2020-02-27 MED ORDER — KETOROLAC TROMETHAMINE 30 MG/ML IJ SOLN
30.0000 mg | Freq: Four times a day (QID) | INTRAMUSCULAR | Status: DC
  Administered 2020-02-28: 30 mg via INTRAVENOUS

## 2020-02-27 MED ORDER — CEFAZOLIN SODIUM-DEXTROSE 2-3 GM-%(50ML) IV SOLR
INTRAVENOUS | Status: DC | PRN
  Administered 2020-02-27: 2 g via INTRAVENOUS

## 2020-02-27 MED ORDER — OXYCODONE-ACETAMINOPHEN 5-325 MG PO TABS
1.0000 | ORAL_TABLET | ORAL | Status: DC | PRN
  Administered 2020-02-27 – 2020-02-28 (×4): 2 via ORAL

## 2020-02-27 MED ORDER — OXYCODONE-ACETAMINOPHEN 5-325 MG PO TABS
ORAL_TABLET | ORAL | Status: AC
  Filled 2020-02-27: qty 1

## 2020-02-27 MED ORDER — FENTANYL CITRATE (PF) 100 MCG/2ML IJ SOLN
INTRAMUSCULAR | Status: AC
  Filled 2020-02-27: qty 2

## 2020-02-27 MED ORDER — ONDANSETRON HCL 4 MG/2ML IJ SOLN
INTRAMUSCULAR | Status: DC | PRN
  Administered 2020-02-27: 4 mg via INTRAVENOUS

## 2020-02-27 MED ORDER — ROCURONIUM BROMIDE 10 MG/ML (PF) SYRINGE
PREFILLED_SYRINGE | INTRAVENOUS | Status: AC
  Filled 2020-02-27: qty 10

## 2020-02-27 MED ORDER — ACETAMINOPHEN 500 MG PO TABS
1000.0000 mg | ORAL_TABLET | Freq: Once | ORAL | Status: AC
  Administered 2020-02-27: 1000 mg via ORAL

## 2020-02-27 MED ORDER — DEXMEDETOMIDINE HCL 200 MCG/2ML IV SOLN
INTRAVENOUS | Status: DC | PRN
  Administered 2020-02-27: 4 ug via INTRAVENOUS
  Administered 2020-02-27: 16 ug via INTRAVENOUS
  Administered 2020-02-27 (×2): 4 ug via INTRAVENOUS

## 2020-02-27 MED ORDER — OXYCODONE-ACETAMINOPHEN 5-325 MG PO TABS
ORAL_TABLET | ORAL | Status: AC
  Filled 2020-02-27: qty 2

## 2020-02-27 MED ORDER — FENTANYL CITRATE (PF) 100 MCG/2ML IJ SOLN
25.0000 ug | INTRAMUSCULAR | Status: DC | PRN
  Administered 2020-02-27: 50 ug via INTRAVENOUS

## 2020-02-27 MED ORDER — OXYCODONE HCL 5 MG/5ML PO SOLN: 5.0000 mg | Freq: Once | ORAL | Status: DC | PRN

## 2020-02-27 MED ORDER — HYDROMORPHONE HCL 1 MG/ML IJ SOLN
0.2500 mg | INTRAMUSCULAR | Status: DC | PRN
  Administered 2020-02-27 (×2): 0.5 mg via INTRAVENOUS

## 2020-02-27 MED ORDER — ONDANSETRON HCL 4 MG/2ML IJ SOLN: 4.0000 mg | Freq: Once | INTRAMUSCULAR | Status: DC

## 2020-02-27 MED ORDER — TRAMADOL HCL 50 MG PO TABS
50.0000 mg | ORAL_TABLET | Freq: Four times a day (QID) | ORAL | Status: DC | PRN
  Administered 2020-02-28: 50 mg via ORAL

## 2020-02-27 MED ORDER — BUPIVACAINE HCL (PF) 0.25 % IJ SOLN
INTRAMUSCULAR | Status: DC | PRN
  Administered 2020-02-27: 30 mL

## 2020-02-27 MED ORDER — SUGAMMADEX SODIUM 200 MG/2ML IV SOLN
INTRAVENOUS | Status: DC | PRN
  Administered 2020-02-27: 200 mg via INTRAVENOUS

## 2020-02-27 MED ORDER — DEXTROSE IN LACTATED RINGERS 5 % IV SOLN: INTRAVENOUS | Status: DC

## 2020-02-27 MED ORDER — MIDAZOLAM HCL 2 MG/2ML IJ SOLN
INTRAMUSCULAR | Status: DC | PRN
  Administered 2020-02-27: 2 mg via INTRAVENOUS

## 2020-02-27 MED ORDER — DEXAMETHASONE SODIUM PHOSPHATE 10 MG/ML IJ SOLN
INTRAMUSCULAR | Status: AC
  Filled 2020-02-27: qty 1

## 2020-02-27 MED ORDER — HYDROMORPHONE HCL 1 MG/ML IJ SOLN
INTRAMUSCULAR | Status: AC
  Filled 2020-02-27: qty 1

## 2020-02-27 MED ORDER — CEFAZOLIN SODIUM 1 G IJ SOLR
INTRAMUSCULAR | Status: AC
  Filled 2020-02-27: qty 20

## 2020-02-27 MED ORDER — LACTATED RINGERS IV SOLN: INTRAVENOUS | Status: DC

## 2020-02-27 MED ORDER — LIDOCAINE 2% (20 MG/ML) 5 ML SYRINGE
INTRAMUSCULAR | Status: DC | PRN
Start: 2020-02-27 — End: 2020-02-27
  Administered 2020-02-27: 1.5 mg/kg/h via INTRAVENOUS

## 2020-02-27 MED ORDER — IBUPROFEN 800 MG PO TABS: 800.0000 mg | ORAL_TABLET | Freq: Four times a day (QID) | ORAL | Status: DC

## 2020-02-27 MED ORDER — ACETAMINOPHEN 500 MG PO TABS
ORAL_TABLET | ORAL | Status: AC
  Filled 2020-02-27: qty 2

## 2020-02-27 MED ORDER — SODIUM CHLORIDE 0.9 % IV SOLN
INTRAVENOUS | Status: DC | PRN
  Administered 2020-02-27: 60 mL

## 2020-02-27 MED ORDER — DEXAMETHASONE SODIUM PHOSPHATE 10 MG/ML IJ SOLN
INTRAMUSCULAR | Status: DC | PRN
  Administered 2020-02-27 (×2): 5 mg via INTRAVENOUS

## 2020-02-27 SURGICAL SUPPLY — 57 items
BARRIER ADHS 3X4 INTERCEED (GAUZE/BANDAGES/DRESSINGS) IMPLANT
CANISTER SUCT 3000ML PPV (MISCELLANEOUS) ×3 IMPLANT
CATH FOLEY 3WAY  5CC 16FR (CATHETERS)
CATH FOLEY 3WAY 5CC 16FR (CATHETERS) ×1 IMPLANT
CATH SILICONE 16FRX5CC (CATHETERS) ×2 IMPLANT
COVER BACK TABLE 60X90IN (DRAPES) ×3 IMPLANT
COVER TIP SHEARS 8 DVNC (MISCELLANEOUS) ×1 IMPLANT
COVER TIP SHEARS 8MM DA VINCI (MISCELLANEOUS) ×2
COVER WAND RF STERILE (DRAPES) ×3 IMPLANT
DECANTER SPIKE VIAL GLASS SM (MISCELLANEOUS) ×2 IMPLANT
DEFOGGER SCOPE WARMER CLEARIFY (MISCELLANEOUS) ×3 IMPLANT
DERMABOND ADVANCED (GAUZE/BANDAGES/DRESSINGS) ×2
DERMABOND ADVANCED .7 DNX12 (GAUZE/BANDAGES/DRESSINGS) ×1 IMPLANT
DRAPE ARM DVNC X/XI (DISPOSABLE) ×4 IMPLANT
DRAPE COLUMN DVNC XI (DISPOSABLE) ×1 IMPLANT
DRAPE DA VINCI XI ARM (DISPOSABLE) ×8
DRAPE DA VINCI XI COLUMN (DISPOSABLE) ×2
DURAPREP 26ML APPLICATOR (WOUND CARE) ×3 IMPLANT
ELECT REM PT RETURN 9FT ADLT (ELECTROSURGICAL) ×3
ELECTRODE REM PT RTRN 9FT ADLT (ELECTROSURGICAL) ×1 IMPLANT
GAUZE 4X4 16PLY RFD (DISPOSABLE) ×3 IMPLANT
GLOVE BIO SURGEON STRL SZ7 (GLOVE) IMPLANT
GLOVE BIO SURGEON STRL SZ7.5 (GLOVE) ×9 IMPLANT
GLOVE BIOGEL PI IND STRL 7.0 (GLOVE) IMPLANT
GLOVE BIOGEL PI INDICATOR 7.0 (GLOVE)
IRRIG SUCT STRYKERFLOW 2 WTIP (MISCELLANEOUS) ×3
IRRIGATION SUCT STRKRFLW 2 WTP (MISCELLANEOUS) ×1 IMPLANT
NEEDLE INSUFFLATION 150MM (ENDOMECHANICALS) ×3 IMPLANT
OBTURATOR OPTICAL STANDARD 8MM (TROCAR) ×2
OBTURATOR OPTICAL STND 8 DVNC (TROCAR) ×1
OBTURATOR OPTICALSTD 8 DVNC (TROCAR) ×1 IMPLANT
OCCLUDER COLPOPNEUMO (BALLOONS) ×3 IMPLANT
PACK ROBOT WH (CUSTOM PROCEDURE TRAY) ×3 IMPLANT
PACK ROBOTIC GOWN (GOWN DISPOSABLE) ×3 IMPLANT
PACK TRENDGUARD 450 HYBRID PRO (MISCELLANEOUS) IMPLANT
PAD PREP 24X48 CUFFED NSTRL (MISCELLANEOUS) ×3 IMPLANT
PROTECTOR NERVE ULNAR (MISCELLANEOUS) ×6 IMPLANT
SEAL CANN UNIV 5-8 DVNC XI (MISCELLANEOUS) ×3 IMPLANT
SEAL XI 5MM-8MM UNIVERSAL (MISCELLANEOUS) ×6
SET IRRIG Y TYPE TUR BLADDER L (SET/KITS/TRAYS/PACK) IMPLANT
SET TRI-LUMEN FLTR TB AIRSEAL (TUBING) ×3 IMPLANT
SPONGE LAP 4X18 RFD (DISPOSABLE) IMPLANT
SUT VIC AB 0 CT1 27 (SUTURE) ×2
SUT VIC AB 0 CT1 27XBRD ANBCTR (SUTURE) ×1 IMPLANT
SUT VICRYL 0 UR6 27IN ABS (SUTURE) ×3 IMPLANT
SUT VICRYL RAPIDE 4/0 PS 2 (SUTURE) ×6 IMPLANT
SUT VLOC 180 0 9IN  GS21 (SUTURE) ×2
SUT VLOC 180 0 9IN GS21 (SUTURE) ×1 IMPLANT
TIP RUMI ORANGE 6.7MMX12CM (TIP) IMPLANT
TIP UTERINE 5.1X6CM LAV DISP (MISCELLANEOUS) IMPLANT
TIP UTERINE 6.7X10CM GRN DISP (MISCELLANEOUS) IMPLANT
TIP UTERINE 6.7X6CM WHT DISP (MISCELLANEOUS) ×2 IMPLANT
TIP UTERINE 6.7X8CM BLUE DISP (MISCELLANEOUS) IMPLANT
TOWEL OR 17X26 10 PK STRL BLUE (TOWEL DISPOSABLE) ×3 IMPLANT
TRENDGUARD 450 HYBRID PRO PACK (MISCELLANEOUS) ×3
TROCAR PORT AIRSEAL 8X120 (TROCAR) ×3 IMPLANT
WATER STERILE IRR 1000ML POUR (IV SOLUTION) ×3 IMPLANT

## 2020-02-27 NOTE — Op Note (Signed)
NAME: Haley Wong, FRAYNE MEDICAL RECORD DG:[id number] ACCOUNT [id number] DATE OF BIRTH:02-14-1986 FACILITY: [facility] LOCATION: [facility] PHYSICIAN:Chelbie Jarnagin J. Kewanda Poland, MD  OPERATIVE REPORT  DATE OF PROCEDURE:  02/27/2020  PREOPERATIVE DIAGNOSIS:  Severe secondary dysmenorrhea refractory to hormonal medications and intrauterine device.  POSTOPERATIVE DIAGNOSES: 1.  Severe secondary dysmenorrhea refractory to hormonal medications and intrauterine device. 2.  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  PROCEDURES:   1.  da Vinci-assisted total laparoscopic hysterectomy.  2.  Bilateral salpingectomy.  3.  Lysis of cul-de-sac and left adnexal adhesions.  4.  McCall culdoplasty.  SURGEON:  Olivia Mackie, MD  ASSISTANT:  Herma Mering, RNFA.  ESTIMATED BLOOD LOSS:  50 mL.  COMPLICATIONS:  None.  DRAINS:  Foley.  COUNTS:  Correct.  DISPOSITION:  The patient was taken to recovery in good condition.  SPECIMENS:  Uterus, cervix, bilateral tubes to pathology.  BRIEF OPERATIVE NOTE:  After being apprised of the risks of anesthesia, infection, bleeding, injury to surrounding organs, possible need for repair, delayed versus immediate complications to include bowel and bladder, internal vessel injury, possible  need for repair, consent was signed.  The patient was brought to the operating room, prepped and draped in the usual sterile fashion after being placed under general anesthesia, Foley catheter placed.  Feet are placed in Yellofin stirrups.  RUMI  retractor placed vaginally without difficulty.  Attention was turned to the abdominal portion of the procedure, whereby an infraumbilical incision was made with a scalpel.  Veress needle placed, opening pressure minus 2.  Three liters CO2 insufflated  without difficulty.  Trocar placed atraumatically.  Pictures taken.  Normal liver, gallbladder bed.  Normal appendix.  Normal anterior cul-de-sac.  Posterior cul-de-sac has evidence of  possible phlebolith and some cul-de-sac adhesions.  The anterior  cul-de-sac was clear.  Normal ovaries, normal tubes noted.  At this time, the 2 ports were made on the left and 1 on the right, 2 robotic ports, all placed under direct visualization, AirSeal established.  Deep Trendelenburg position was established.   Robot docked in standard fashion.  EndoShear and bipolar forceps placed.  Cul-de-sac adhesions and left adnexal adhesions were lysed sharply.  The phlebolith was inspected and found to be not attached in the posterior cul-de-sac.  The left tube was  undermined and divided using bipolar and monopolar cautery.  The retroperitoneal space entered on the left.  Ureter identified along the medial leaf of the peritoneum.  The left tubo-ovarian ligament was clamped and cut using unipolar and bipolar  cautery.  The round ligament was clamped and cut using unipolar and bipolar cautery.  The uterine vessels on the left were skeletonized using sharp dissection.  The bladder flap was developed sharply.  On the right side, the tube was undermined as well  and divided using monopolar and bipolar cautery.  The retroperitoneal space was entered.  The ureter was identified along the medial leaf of the peritoneum.  The tubo-ovarian ligament on the right was clamped and cut using monopolar and bipolar cautery.   The round ligament was opened using bipolar and monopolar cautery.  The right uterine vessels were skeletonized, cauterized using bipolar cautery and cut.  The uterine vessels were cauterized and cut on the left as well.  The bladder flap was completely  developed.  The ureters were seen peristalsing normally.  The RUMI cup was divided at the cervicovaginal junction and the specimen was retracted into the vagina, as well as the phlebolith which was removed.  At this  time, the vagina was closed using a 0  V-Loc suture in continuous running fashion.  There was bleeding along the medial and left lateral  portion of the cuff, which was well controlled placing a locked suture at the left lateral margin.  This was inspected.  No evidence of vaginal bleeding  and no evidence of hematoma formation.  A second layer was placed.  McCall culdoplasty suture was placed.  Irrigation was accomplished.  Good hemostasis was noted.  Ureters were seen peristalsing bilaterally and of normal size and caliber.  Urine was  clear.  Attention was turned to the trocars, which were all removed under direct visualization.  Robot was undocked.  CO2 was released.  Positive pressure applied.  Ropivacaine solution placed.  Marcaine solution placed in all incisions, which were  closed using 4-0 Vicryl in an interrupted fashion.  Vaginal exam reveals a well-approximated vaginal cuff.  Minimal bleeding noted.  No hematomas noted along the left lateral portion of the cuff.  The patient tolerated the procedure well, was awakened  and transferred to recovery in good condition.  VN/NUANCE  D:02/27/2020 T:02/27/2020 JOB:[id number]

## 2020-02-27 NOTE — Op Note (Signed)
02/27/2020  2:22 PM  PATIENT:  Haley Wong  33 y.o. female  PRE-OPERATIVE DIAGNOSIS:  Dysfunctional Uterine Bleeding, Dysmenorrhea  POST-OPERATIVE DIAGNOSIS:  Dysfunctional Uterine Bleeding, Dysmenorrhea CUL DE SAC ADHESIONS ENTEROCELE  PROCEDURE:  Procedure(s): XI ROBOTIC ASSISTED LAPAROSCOPIC HYSTERECTOMY  BILATERAL SALPINGECTOMY LYSIS OF LEFT ADNEXAL AND CUL DE SAC ADHESIONS MCCALL CUL DE PLASTY  SURGEON:  Surgeon(s): Olivia Mackie, MD  ASSISTANTSKennith Center, RNFA   ANESTHESIA:   local and general  ESTIMATED BLOOD LOSS: 20 mL   DRAINS: Urinary Catheter (Foley)   LOCAL MEDICATIONS USED:  MARCAINE     SPECIMEN:  Source of Specimen:  UTERUS, CERVIX, TUBES  DISPOSITION OF SPECIMEN:  PATHOLOGY  COUNTS:  YES  DICTATION #: [id number]  PLAN OF CARE: DC HOME  PATIENT DISPOSITION:  PACU - hemodynamically stable.

## 2020-02-27 NOTE — Progress Notes (Signed)
POD 1 Procedure(s) (LRB): XI ROBOTIC ASSISTED LAPAROSCOPIC HYSTERECTOMY AND SALPINGECTOMY (Bilateral)  Subjective: Patient reports nausea, + flatus and no problems voiding.    Objective: BP (!) 105/46 (BP Location: Right Arm)   Pulse 63   Temp 98.1 F (36.7 C) (Axillary)   Resp 16   Ht 5\' 3"  (1.6 m)   Wt 92.7 kg   LMP 02/19/2020   SpO2 100%   BMI 36.19 kg/m   CBC    Component Value Date/Time   WBC 10.8 (H) 02/28/2020 0531   RBC 4.37 02/28/2020 0531   HGB 12.8 02/28/2020 0531   HGB 13.3 10/26/2016 0948   HCT 39.8 02/28/2020 0531   HCT 40.2 10/26/2016 0948   PLT 292 02/28/2020 0531   PLT 306 10/26/2016 0948   MCV 91.1 02/28/2020 0531   MCV 89 10/26/2016 0948   MCV 89 05/01/2014 1805   MCH 29.3 02/28/2020 0531   MCHC 32.2 02/28/2020 0531   RDW 11.9 02/28/2020 0531   RDW 13.5 10/26/2016 0948   RDW 12.7 05/01/2014 1805   LYMPHSABS 1.7 10/26/2016 0948   LYMPHSABS 2.0 05/01/2014 1805   MONOABS 0.5 05/01/2014 1805   EOSABS 0.1 10/26/2016 0948   EOSABS 0.1 05/01/2014 1805   BASOSABS 0.0 10/26/2016 0948   BASOSABS 0.0 05/01/2014 1805    I have reviewed patient's vital signs, intake and output, medications and labs.  General: alert, cooperative and appears stated age Resp: clear to auscultation bilaterally and normal percussion bilaterally Cardio: regular rate and rhythm, S1, S2 normal, no murmur, click, rub or gallop GI: soft, non-tender; bowel sounds normal; no masses,  no organomegaly and incision: clean, dry and intact Extremities: extremities normal, atraumatic, no cyanosis or edema and Homans sign is negative, no sign of DVT Vaginal Bleeding: minimal  Assessment: s/p Procedure(s) with comments: XI ROBOTIC ASSISTED LAPAROSCOPIC HYSTERECTOMY AND SALPINGECTOMY (Bilateral) - Tracie RNFA confirmed on 02/10/20 CS: stable, progressing well and tolerating diet  Plan: Advance diet Encourage ambulation Advance to PO medication Discontinue IV fluids Discharge home  LOS: 0 days    Shaneequa Bahner J 02/27/2020, 9:41 PM

## 2020-02-27 NOTE — Transfer of Care (Signed)
Immediate Anesthesia Transfer of Care Note  Patient: Haley Wong  Procedure(s) Performed: Procedure(s) (LRB): XI ROBOTIC ASSISTED LAPAROSCOPIC HYSTERECTOMY AND SALPINGECTOMY (Bilateral)  Patient Location: PACU  Anesthesia Type: General  Level of Consciousness: awake, oriented, sedated and patient cooperative  Airway & Oxygen Therapy: Patient Spontanous Breathing and Patient connected to face mask oxygen  Post-op Assessment: Report given to PACU RN and Post -op Vital signs reviewed and stable  Post vital signs: Reviewed and stable  Complications: No apparent anesthesia complications Last Vitals:  Vitals Value Taken Time  BP 115/65 02/27/20 1445  Temp 36.6 C 02/27/20 1440  Pulse 64 02/27/20 1446  Resp 17 02/27/20 1446  SpO2 99 % 02/27/20 1446  Vitals shown include unvalidated device data.  Last Pain:  Vitals:   02/27/20 1106  TempSrc: Oral      Patients Stated Pain Goal: 3 (02/27/20 1106)

## 2020-02-27 NOTE — Anesthesia Preprocedure Evaluation (Addendum)
Anesthesia Evaluation  Patient identified by MRN, date of birth, ID band Patient awake    Reviewed: Allergy & Precautions, NPO status , Patient's Chart, lab work & pertinent test results  Airway Mallampati: II  TM Distance: >3 FB Neck ROM: Full    Dental no notable dental hx. (+) Teeth Intact, Dental Advisory Given   Pulmonary asthma ,    Pulmonary exam normal breath sounds clear to auscultation       Cardiovascular Exercise Tolerance: Good Normal cardiovascular exam+ dysrhythmias  Rhythm:Regular Rate:Normal     Neuro/Psych  Headaches, Anxiety    GI/Hepatic Neg liver ROS, GERD  ,  Endo/Other  negative endocrine ROS  Renal/GU negative Renal ROS     Musculoskeletal negative musculoskeletal ROS (+)   Abdominal (+) + obese,   Peds  Hematology Hgb 13.2   Anesthesia Other Findings   Reproductive/Obstetrics negative OB ROS                            Anesthesia Physical Anesthesia Plan  ASA: II  Anesthesia Plan: General   Post-op Pain Management:    Induction: Intravenous  PONV Risk Score and Plan: 4 or greater and Treatment may vary due to age or medical condition, Ondansetron and Dexamethasone  Airway Management Planned: Oral ETT  Additional Equipment: None  Intra-op Plan:   Post-operative Plan: Extubation in OR  Informed Consent: I have reviewed the patients History and Physical, chart, labs and discussed the procedure including the risks, benefits and alternatives for the proposed anesthesia with the patient or authorized representative who has indicated his/her understanding and acceptance.     Dental advisory given  Plan Discussed with:   Anesthesia Plan Comments: (Ga Plus lidocaine infusion + Dexmedetomidine 0.4 mcg/kg)       Anesthesia Quick Evaluation

## 2020-02-27 NOTE — Anesthesia Procedure Notes (Signed)
Procedure Name: Intubation Date/Time: 02/27/2020 1:01 PM Performed by: Suan Halter, CRNA Pre-anesthesia Checklist: Patient identified, Emergency Drugs available, Suction available and Patient being monitored Patient Re-evaluated:Patient Re-evaluated prior to induction Oxygen Delivery Method: Circle system utilized Preoxygenation: Pre-oxygenation with 100% oxygen Induction Type: IV induction Ventilation: Mask ventilation without difficulty Laryngoscope Size: Mac and 3 Grade View: Grade I Tube type: Oral Tube size: 7.0 mm Number of attempts: 1 Airway Equipment and Method: Stylet and Oral airway Placement Confirmation: ETT inserted through vocal cords under direct vision,  positive ETCO2 and breath sounds checked- equal and bilateral Secured at: 22 cm Tube secured with: Tape Dental Injury: Teeth and Oropharynx as per pre-operative assessment

## 2020-02-28 DIAGNOSIS — N945 Secondary dysmenorrhea: Secondary | ICD-10-CM | POA: Diagnosis not present

## 2020-02-28 LAB — CBC
HCT: 39.8 % (ref 36.0–46.0)
Hemoglobin: 12.8 g/dL (ref 12.0–15.0)
MCH: 29.3 pg (ref 26.0–34.0)
MCHC: 32.2 g/dL (ref 30.0–36.0)
MCV: 91.1 fL (ref 80.0–100.0)
Platelets: 292 10*3/uL (ref 150–400)
RBC: 4.37 MIL/uL (ref 3.87–5.11)
RDW: 11.9 % (ref 11.5–15.5)
WBC: 10.8 10*3/uL — ABNORMAL HIGH (ref 4.0–10.5)
nRBC: 0 % (ref 0.0–0.2)

## 2020-02-28 LAB — SURGICAL PATHOLOGY

## 2020-02-28 MED ORDER — TRAMADOL HCL 50 MG PO TABS
ORAL_TABLET | ORAL | Status: AC
  Filled 2020-02-28: qty 1

## 2020-02-28 MED ORDER — OXYCODONE-ACETAMINOPHEN 5-325 MG PO TABS
ORAL_TABLET | ORAL | Status: AC
  Filled 2020-02-28: qty 2

## 2020-02-28 MED ORDER — TRAMADOL HCL 50 MG PO TABS: 50.0000 mg | ORAL_TABLET | Freq: Four times a day (QID) | ORAL | 0 refills | Status: DC | PRN

## 2020-02-28 MED ORDER — KETOROLAC TROMETHAMINE 30 MG/ML IJ SOLN
INTRAMUSCULAR | Status: AC
  Filled 2020-02-28: qty 1

## 2020-02-28 MED ORDER — OXYCODONE-ACETAMINOPHEN 5-325 MG PO TABS: 1.0000 | ORAL_TABLET | ORAL | 0 refills | Status: DC | PRN

## 2020-02-28 NOTE — Discharge Instructions (Signed)
Laparoscopically Assisted Vaginal Hysterectomy, Care After This sheet gives you information about how to care for yourself after your procedure. Your health care provider may also give you more specific instructions. If you have problems or questions, contact your health care provider. What can I expect after the procedure? After the procedure, it is common to have:  Soreness and numbness in your incision areas.  Abdominal pain. You will be given pain medicine to control it.  Vaginal bleeding and discharge. You will need to use a sanitary napkin after this procedure.  Sore throat from the breathing tube that was inserted during surgery. Follow these instructions at home: Medicines  Take over-the-counter and prescription medicines only as told by your health care provider.  Do not take aspirin or ibuprofen. These medicines can cause bleeding.  Do not drive or use heavy machinery while taking prescription pain medicine.  Do not drive for 24 hours if you were given a medicine to help you relax (sedative) during the procedure. Incision care   Follow instructions from your health care provider about how to take care of your incisions. Make sure you: ? Wash your hands with soap and water before you change your bandage (dressing). If soap and water are not available, use hand sanitizer. ? Change your dressing as told by your health care provider. ? Leave stitches (sutures), skin glue, or adhesive strips in place. These skin closures may need to stay in place for 2 weeks or longer. If adhesive strip edges start to loosen and curl up, you may trim the loose edges. Do not remove adhesive strips completely unless your health care provider tells you to do that.  Check your incision area every day for signs of infection. Check for: ? Redness, swelling, or pain. ? Fluid or blood. ? Warmth. ? Pus or a bad smell. Activity  Get regular exercise as told by your health care provider. You may be  told to take short walks every day and go farther each time.  Return to your normal activities as told by your health care provider. Ask your health care provider what activities are safe for you.  Do not douche, use tampons, or have sexual intercourse for at least 6 weeks, or until your health care provider gives you permission.  Do not lift anything that is heavier than 10 lb (4.5 kg), or the limit that your health care provider tells you, until he or she says that it is safe. General instructions  Do not take baths, swim, or use a hot tub until your health care provider approves. Take showers instead of baths.  Do not drive for 24 hours if you received a sedative.  Do not drive or operate heavy machinery while taking prescription pain medicine.  To prevent or treat constipation while you are taking prescription pain medicine, your health care provider may recommend that you: ? Drink enough fluid to keep your urine clear or pale yellow. ? Take over-the-counter or prescription medicines. ? Eat foods that are high in fiber, such as fresh fruits and vegetables, whole grains, and beans. ? Limit foods that are high in fat and processed sugars, such as fried and sweet foods.  Keep all follow-up visits as told by your health care provider. This is important. Contact a health care provider if:  You have signs of infection, such as: ? Redness, swelling, or pain around your incision sites. ? Fluid or blood coming from an incision. ? An incision that feels warm to the   touch. ? Pus or a bad smell coming from an incision.  Your incision breaks open.  Your pain medicine is not helping.  You feel dizzy or light-headed.  You have pain or bleeding when you urinate.  You have persistent nausea and vomiting.  You have blood, pus, or a bad-smelling discharge from your vagina. Get help right away if:  You have a fever.  You have severe abdominal pain.  You have chest pain.  You have  shortness of breath.  You faint.  You have pain, swelling, or redness in your leg.  You have heavy bleeding from your vagina. Summary  After the procedure, it is common to have abdominal pain and vaginal bleeding.  You should not drive or lift heavy objects until your health care provider says that it is safe.  Contact your health care provider if you have any symptoms of infection, excessive vaginal bleeding, nausea, vomiting, or shortness of breath. This information is not intended to replace advice given to you by your health care provider. Make sure you discuss any questions you have with your health care provider. Document Revised: 09/15/2017 Document Reviewed: 11/29/2016 Elsevier Patient Education  2020 [employer]  

## 2020-02-28 NOTE — Anesthesia Postprocedure Evaluation (Signed)
Anesthesia Post Note  Patient: [name]  Procedure(s) Performed: XI ROBOTIC ASSISTED LAPAROSCOPIC HYSTERECTOMY AND SALPINGECTOMY (Bilateral Abdomen)     Patient location during evaluation: PACU Anesthesia Type: General Level of consciousness: awake and alert Pain management: pain level controlled Vital Signs Assessment: post-procedure vital signs reviewed and stable Respiratory status: spontaneous breathing, nonlabored ventilation, respiratory function stable and patient connected to nasal cannula oxygen Cardiovascular status: blood pressure returned to baseline and stable Postop Assessment: no apparent nausea or vomiting Anesthetic complications: no    Last Vitals:  Vitals:   02/28/20 0045 02/28/20 0519  BP: 109/60 (!) 105/46  Pulse: 72 63  Resp: 14 16  Temp: 36.7 C 36.7 C  SpO2: 99% 100%    Last Pain:  Vitals:   02/28/20 0907  TempSrc:   PainSc: 4                  [name]

## 2020-03-26 ENCOUNTER — Other Ambulatory Visit: Payer: Self-pay | Admitting: Neurology

## 2020-03-26 DIAGNOSIS — M542 Cervicalgia: Secondary | ICD-10-CM

## 2020-03-26 DIAGNOSIS — G43119 Migraine with aura, intractable, without status migrainosus: Secondary | ICD-10-CM

## 2020-04-08 ENCOUNTER — Ambulatory Visit: Payer: BC Managed Care – PPO

## 2020-04-13 ENCOUNTER — Ambulatory Visit
Admission: RE | Admit: 2020-04-13 | Discharge: 2020-04-13 | Disposition: A | Discharge status: Home / Self Care | Payer: BC Managed Care – PPO | Source: Ambulatory Visit | Attending: Neurology | Admitting: Neurology

## 2020-04-13 ENCOUNTER — Other Ambulatory Visit: Payer: Self-pay

## 2020-04-13 DIAGNOSIS — G43119 Migraine with aura, intractable, without status migrainosus: Secondary | ICD-10-CM | POA: Diagnosis present

## 2020-04-13 DIAGNOSIS — M542 Cervicalgia: Secondary | ICD-10-CM

## 2020-09-18 ENCOUNTER — Other Ambulatory Visit: Payer: Self-pay

## 2020-09-18 ENCOUNTER — Emergency Department: Payer: BC Managed Care – PPO

## 2020-09-18 ENCOUNTER — Encounter: Payer: Self-pay | Admitting: Emergency Medicine

## 2020-09-18 ENCOUNTER — Emergency Department
Admission: EM | Admit: 2020-09-18 | Discharge: 2020-09-18 | Disposition: A | Discharge status: Home / Self Care | Payer: BC Managed Care – PPO | Attending: Emergency Medicine | Admitting: Emergency Medicine

## 2020-09-18 DIAGNOSIS — R1010 Upper abdominal pain, unspecified: Secondary | ICD-10-CM | POA: Insufficient documentation

## 2020-09-18 DIAGNOSIS — J45909 Unspecified asthma, uncomplicated: Secondary | ICD-10-CM | POA: Diagnosis not present

## 2020-09-18 DIAGNOSIS — Z91040 Latex allergy status: Secondary | ICD-10-CM | POA: Insufficient documentation

## 2020-09-18 DIAGNOSIS — K219 Gastro-esophageal reflux disease without esophagitis: Secondary | ICD-10-CM | POA: Insufficient documentation

## 2020-09-18 LAB — CBC WITH DIFFERENTIAL/PLATELET
Abs Immature Granulocytes: 0.02 10*3/uL (ref 0.00–0.07)
Basophils Absolute: 0 10*3/uL (ref 0.0–0.1)
Basophils Relative: 0 %
Eosinophils Absolute: 0.1 10*3/uL (ref 0.0–0.5)
Eosinophils Relative: 1 %
HCT: 42.4 % (ref 36.0–46.0)
Hemoglobin: 14.1 g/dL (ref 12.0–15.0)
Immature Granulocytes: 0 %
Lymphocytes Relative: 25 %
Lymphs Abs: 2 10*3/uL (ref 0.7–4.0)
MCH: 29.7 pg (ref 26.0–34.0)
MCHC: 33.3 g/dL (ref 30.0–36.0)
MCV: 89.3 fL (ref 80.0–100.0)
Monocytes Absolute: 0.6 10*3/uL (ref 0.1–1.0)
Monocytes Relative: 8 %
Neutro Abs: 5.1 10*3/uL (ref 1.7–7.7)
Neutrophils Relative %: 66 %
Platelets: 323 10*3/uL (ref 150–400)
RBC: 4.75 MIL/uL (ref 3.87–5.11)
RDW: 12.7 % (ref 11.5–15.5)
WBC: 7.8 10*3/uL (ref 4.0–10.5)
nRBC: 0 % (ref 0.0–0.2)

## 2020-09-18 LAB — COMPREHENSIVE METABOLIC PANEL
ALT: 20 U/L (ref 0–44)
AST: 19 U/L (ref 15–41)
Albumin: 4.2 g/dL (ref 3.5–5.0)
Alkaline Phosphatase: 69 U/L (ref 38–126)
Anion gap: 10 (ref 5–15)
BUN: 14 mg/dL (ref 6–20)
CO2: 26 mmol/L (ref 22–32)
Calcium: 9.3 mg/dL (ref 8.9–10.3)
Chloride: 102 mmol/L (ref 98–111)
Creatinine, Ser: 0.9 mg/dL (ref 0.44–1.00)
GFR, Estimated: >60 mL/min (ref >60)
Glucose, Bld: 102 mg/dL — ABNORMAL HIGH (ref 70–99)
Potassium: 4.6 mmol/L (ref 3.5–5.1)
Sodium: 138 mmol/L (ref 135–145)
Total Bilirubin: 0.6 mg/dL (ref 0.3–1.2)
Total Protein: 7.7 g/dL (ref 6.5–8.1)

## 2020-09-18 LAB — URINALYSIS, COMPLETE (UACMP) WITH MICROSCOPIC
Bilirubin Urine: NEGATIVE
Glucose, UA: NEGATIVE mg/dL
Hgb urine dipstick: NEGATIVE
Ketones, ur: NEGATIVE mg/dL
Leukocytes,Ua: NEGATIVE
Nitrite: NEGATIVE
Protein, ur: 30 mg/dL — AB
Specific Gravity, Urine: 1.02 (ref 1.005–1.030)
Squamous Epithelial / HPF: >50 — ABNORMAL HIGH (ref 0–5)
pH: 5 (ref 5.0–8.0)

## 2020-09-18 LAB — POC URINE PREG, ED: Preg Test, Ur: NEGATIVE

## 2020-09-18 LAB — LIPASE, BLOOD: Lipase: 27 U/L (ref 11–51)

## 2020-09-18 MED ORDER — DICYCLOMINE HCL 20 MG PO TABS
20.0000 mg | ORAL_TABLET | Freq: Once | ORAL | Status: AC
  Administered 2020-09-18: 20 mg via ORAL
  Filled 2020-09-18: qty 1

## 2020-09-18 MED ORDER — PANTOPRAZOLE SODIUM 20 MG PO TBEC: 20.0000 mg | DELAYED_RELEASE_TABLET | Freq: Every day | ORAL | 0 refills | Status: AC

## 2020-09-18 MED ORDER — SORBITOL 70 % SOLN
960.0000 mL | TOPICAL_OIL | Freq: Once | ORAL | Status: AC
  Administered 2020-09-18: 960 mL via RECTAL
  Filled 2020-09-18: qty 240

## 2020-09-18 MED ORDER — DICYCLOMINE HCL 10 MG PO CAPS: 10.0000 mg | ORAL_CAPSULE | Freq: Four times a day (QID) | ORAL | 0 refills | Status: AC

## 2020-09-18 MED ORDER — MAGNESIUM CITRATE PO SOLN
1.0000 | Freq: Once | ORAL | Status: AC
  Administered 2020-09-18: 1 via ORAL
  Filled 2020-09-18: qty 296

## 2020-09-18 MED ORDER — HALOPERIDOL LACTATE 5 MG/ML IJ SOLN
2.5000 mg | Freq: Once | INTRAMUSCULAR | Status: AC
  Administered 2020-09-18: 2.5 mg via INTRAVENOUS
  Filled 2020-09-18: qty 1

## 2020-09-18 MED ORDER — SODIUM CHLORIDE 0.9 % IV BOLUS
1000.0000 mL | Freq: Once | INTRAVENOUS | Status: AC
  Administered 2020-09-18: 1000 mL via INTRAVENOUS

## 2020-09-18 MED ORDER — ALUM & MAG HYDROXIDE-SIMETH 200-200-20 MG/5ML PO SUSP
30.0000 mL | Freq: Once | ORAL | Status: AC
  Administered 2020-09-18: 30 mL via ORAL
  Filled 2020-09-18: qty 30

## 2020-09-18 MED ORDER — ONDANSETRON HCL 4 MG PO TABS: 4.0000 mg | ORAL_TABLET | Freq: Every day | ORAL | 0 refills | Status: DC | PRN

## 2020-09-18 MED ORDER — LIDOCAINE VISCOUS HCL 2 % MT SOLN
15.0000 mL | Freq: Once | OROMUCOSAL | Status: AC
  Administered 2020-09-18: 15 mL via ORAL
  Filled 2020-09-18: qty 15

## 2020-09-18 MED ORDER — IOHEXOL 350 MG/ML SOLN
100.0000 mL | Freq: Once | INTRAVENOUS | Status: AC | PRN
  Administered 2020-09-18: 100 mL via INTRAVENOUS
  Filled 2020-09-18: qty 100

## 2020-09-18 MED ORDER — ONDANSETRON HCL 4 MG/2ML IJ SOLN
4.0000 mg | Freq: Once | INTRAMUSCULAR | Status: AC
  Administered 2020-09-18: 4 mg via INTRAVENOUS
  Filled 2020-09-18: qty 2

## 2020-09-18 MED ORDER — PROMETHAZINE HCL 25 MG/ML IJ SOLN
12.5000 mg | Freq: Once | INTRAMUSCULAR | Status: AC
  Administered 2020-09-18: 12.5 mg via INTRAVENOUS
  Filled 2020-09-18: qty 1

## 2020-09-18 NOTE — ED Notes (Signed)
BM noted

## 2020-09-18 NOTE — ED Notes (Addendum)
Pt has notified nurse that she is finished having bowel movement -- when nurse entered room pt oob at sink washing hands -- 100cc liquid bile colored emesis noted in emesis bag -- it is noted that pt did have bowel movment- primarily brown liquid stool with small soft brown formed stool noted - provider A Wagner notified

## 2020-09-18 NOTE — ED Notes (Signed)
Pt awake and alert; reporting at this time ongoing generalized abd cramping rated 5/10 with associated nausea -- pt agreeable to try additional enema for constipation mgmt after receiving PO Bentyl and IM Phenergan (see MAR) -- additional 200cc of enema tolerated and pt unable to tolerate any more -- now on bedside commode with instructions to call nurse via call bell when finished.  Pt also updated that she will receive NS bolus per request

## 2020-09-18 NOTE — ED Notes (Signed)
Pt lying in bed resting with eyes closed; easily aroused with verbal stimulation.  Pt remains a&ox4.  No acute distress noted.  Pt reports pain improved to 2/10 and reports nausea resolved.  Pt agreeable with plan to d/c home as discussed by provider A Wagoner, PA-C.  This  Nurse has verbally reinforced d/c instructions and provided written copy- pt acknowledges verbal understanding and denies any additional questions, concerns, needs

## 2020-09-18 NOTE — ED Notes (Signed)
Pt encouraged to go to restroom to attempt BM. Ambulatory to restroom, NAD noted, steady gait

## 2020-09-18 NOTE — ED Notes (Signed)
RN discussed pt with Dr. Cyril Loosen per Dr. Cyril Loosen draw CBC and CMP and repeat KUB since we are not able to see [facility] x-ray results.

## 2020-09-18 NOTE — ED Notes (Signed)
Pt now ambulatory at discharge escorted by visitor

## 2020-09-18 NOTE — ED Provider Notes (Signed)
[facility] Emergency Department Provider Note  ____________________________________________  Time seen: Approximately 11:39 AM  I have reviewed the triage vital signs and the nursing notes.   HISTORY  Chief Complaint Fecal Impaction    HPI Haley Wong is a 34 y.o. female with a past medical history of anxiety, chronic interstitial cystitis, GERD, gastric ulcer, IBS that presents to emergency department for evaluation of constipation and upper abdominal pain for 1.5 weeks.  Patient states that abdominal pain is primarily to her upper abdomen and spreads across it.  It is crampy in nature. She feels constipated. She is nauseous.  She saw her primary care on Wednesday and started MiraLAX and mag citrate.  She did not have a bowel movement following medications and was provided no relief. She was recommended by her primary care this morning to come to the emergency department.  No fever, vomiting.  Past Medical History:  Diagnosis Date  . Anemia   . Anxiety   . Asthma    excercised induced  . Chronic interstitial cystitis   . Dysrhythmia    tachycardia on B blockers . No longer takes has a resting   HR 80-90's   . Family history of adverse reaction to anesthesia    Dad was combative before  . GERD (gastroesophageal reflux disease)   . Headache(784.0)   . Heart murmur   . Hx of gastric ulcer   . Hx of varicella   . IBS (irritable bowel syndrome)   . Other hemoglobinopathies (HCC)   . Tachycardia    take metoprolol 25mg  daily since 34 yo  . Unspecified hemorrhoids without mention of complication     Patient Active Problem List   Diagnosis Date Noted  . Dysmenorrhea 02/27/2020    Past Surgical History:  Procedure Laterality Date  . COLONOSCOPY    . LAPAROSCOPY  2009   exploratory surgery, diagnosed interstitial cystitis  . ROBOTIC ASSISTED LAPAROSCOPIC HYSTERECTOMY AND SALPINGECTOMY Bilateral 02/27/2020   Procedure: XI ROBOTIC ASSISTED  LAPAROSCOPIC HYSTERECTOMY AND SALPINGECTOMY;  Surgeon: 02/29/2020, MD;  Location: [facility];  Service: Gynecology;  Laterality: Bilateral;  Tracie RNFA confirmed on 02/10/20 CS    Prior to Admission medications   Medication Sig Start Date End Date Taking? Authorizing Provider  dicyclomine (BENTYL) 10 MG capsule Take 1 capsule (10 mg total) by mouth 4 (four) times daily for 14 days. 09/18/20 10/02/20  10/04/20, PA-C  ondansetron (ZOFRAN) 4 MG tablet Take 1 tablet (4 mg total) by mouth daily as needed for nausea or vomiting. 09/18/20 09/18/21  14/3/22, PA-C  oxyCODONE-acetaminophen (PERCOCET/ROXICET) 5-325 MG tablet Take 1-2 tablets by mouth every 4 (four) hours as needed for moderate pain. 02/28/20   03/01/20, MD  pantoprazole (PROTONIX) 20 MG tablet Take 1 tablet (20 mg total) by mouth daily. 09/18/20 09/18/21  14/3/22, PA-C  traMADol (ULTRAM) 50 MG tablet Take 1-2 tablets (50-100 mg total) by mouth every 6 (six) hours as needed (mild pain). 02/28/20   03/01/20, MD    Allergies Amoxicillin, Hydrocodone-homatropine, Latex, Other, and Pineapple  Family History  Problem Relation Age of Onset  . Hypothyroidism Maternal Aunt   . Diabetes Maternal Grandmother   . Kidney disease Maternal Grandmother   . Hypothyroidism Maternal Grandmother   . Diabetes Maternal Grandfather   . Hypertension Maternal Grandfather   . Heart attack Maternal Grandfather   . Hypertension Paternal Grandfather   . Hypothyroidism Maternal Aunt   . Hypothyroidism Maternal Aunt   .  Hypothyroidism Maternal Aunt     Social History Social History   Tobacco Use  . Smoking status: Never Smoker  . Smokeless tobacco: Never Used  Vaping Use  . Vaping Use: Never used  Substance Use Topics  . Alcohol use: Yes    Comment: social    . Drug use: No     Review of Systems  Constitutional: No fever/chills ENT: No upper respiratory complaints. Cardiovascular: No chest  pain. Respiratory: No cough. No SOB. Gastrointestinal: Positive for upper abdominal pain and nausea.  No vomiting.  Musculoskeletal: Negative for musculoskeletal pain. Skin: Negative for rash, abrasions, lacerations, ecchymosis. Neurological: Negative for headaches   ____________________________________________   PHYSICAL EXAM:  VITAL SIGNS: ED Triage Vitals  Enc Vitals Group     BP 09/18/20 0925 (!) 120/101     Pulse Rate 09/18/20 0925 80     Resp 09/18/20 0925 16     Temp 09/18/20 0925 98.6 F (37 C)     Temp Source 09/18/20 0925 Oral     SpO2 09/18/20 0925 98 %     Weight 09/18/20 0926 230 lb (104.3 kg)     Height 09/18/20 0926 5\' 3"  (1.6 m)     Head Circumference --      Peak Flow --      Pain Score 09/18/20 0925 6     Pain Loc --      Pain Edu? --      Excl. in GC? --      Constitutional: Alert and oriented. Well appearing and in no acute distress. Eyes: Conjunctivae are normal. PERRL. EOMI. Head: Atraumatic. ENT:      Ears:      Nose: No congestion/rhinnorhea.      Mouth/Throat: Mucous membranes are moist.  Neck: No stridor. Cardiovascular: Normal rate, regular rhythm.  Good peripheral circulation. Respiratory: Normal respiratory effort without tachypnea or retractions. Lungs CTAB. Good air entry to the bases with no decreased or absent breath sounds. Gastrointestinal: Bowel sounds 4 quadrants. Mild upper abdominal tenderness. No RLQ, LLQ or suprapubic tenderness. No guarding or rigidity. No palpable masses. No distention.  Musculoskeletal: Full range of motion to all extremities. No gross deformities appreciated. Neurologic:  Normal speech and language. No gross focal neurologic deficits are appreciated.  Skin:  Skin is warm, dry and intact. No rash noted. Psychiatric: Mood and affect are normal. Speech and behavior are normal. Patient exhibits appropriate insight and judgement.   ____________________________________________   LABS (all labs ordered are  listed, but only abnormal results are displayed)  Labs Reviewed  COMPREHENSIVE METABOLIC PANEL - Abnormal; Notable for the following components:      Result Value   Glucose, Bld 102 (*)    All other components within normal limits  URINALYSIS, COMPLETE (UACMP) WITH MICROSCOPIC - Abnormal; Notable for the following components:   Color, Urine AMBER (*)    APPearance TURBID (*)    Protein, ur 30 (*)    Bacteria, UA MANY (*)    Squamous Epithelial / LPF >50 (*)    All other components within normal limits  CBC WITH DIFFERENTIAL/PLATELET  LIPASE, BLOOD  POC URINE PREG, ED   ____________________________________________  EKG   ____________________________________________  RADIOLOGY 14/03/21, personally viewed and evaluated these images (plain radiographs) as part of my medical decision making, as well as reviewing the written report by the radiologist.  DG Abdomen 1 View  Result Date: 09/18/2020 CLINICAL DATA:  Fecal impaction by history. EXAM: ABDOMEN - 1 VIEW  COMPARISON:  Abdomen evaluation of May 01, 2014. Report from examination of September 15, 2020 FINDINGS: Three total images encompass the abdomen in supine position. Incidental portions of the lung bases are clear. Visualized bowel gas with abundant stool in the ascending colon transverse colon and proximal portion of descending colon. No significant stool over the area of the rectum. No signs of small bowel dilation. No abnormal calcifications. On limited assessment no acute skeletal process. IMPRESSION: Abundant stool in the ascending colon transverse colon and proximal portion of the descending colon. No sign of obstruction. Electronically Signed   By: Donzetta Kohut M.D.   On: 09/18/2020 10:20   CT ABDOMEN PELVIS W CONTRAST  Result Date: 09/18/2020 CLINICAL DATA:  Acute abdominal pain.  Fecal impaction. EXAM: CT ABDOMEN AND PELVIS WITH CONTRAST TECHNIQUE: Multidetector CT imaging of the abdomen and pelvis was performed  using the standard protocol following bolus administration of intravenous contrast. CONTRAST:  OMNIPAQUE IOHEXOL 350 MG/ML SOLN COMPARISON:  04/29/2014 FINDINGS: Lower Chest: No acute findings. Hepatobiliary: No hepatic masses identified. Gallbladder is unremarkable. No evidence of biliary ductal dilatation. Pancreas:  No mass or inflammatory changes. Spleen: Within normal limits in size and appearance. Adrenals/Urinary Tract: No masses identified. No evidence of ureteral calculi or hydronephrosis. Stomach/Bowel: No evidence of obstruction, inflammatory process or abnormal fluid collections. Normal appendix visualized. Moderate amount of stool seen throughout the colon, without evidence of colonic dilatation or fecal impaction. Vascular/Lymphatic: No pathologically enlarged lymph nodes. No abdominal aortic aneurysm. Reproductive: Prior hysterectomy noted. Adnexal regions are unremarkable in appearance. Other:  None. Musculoskeletal:  No suspicious bone lesions identified. IMPRESSION: Negative.  No acute findings or other significant abnormality. Electronically Signed   By: Danae Orleans M.D.   On: 09/18/2020 12:39    ____________________________________________    PROCEDURES  Procedure(s) performed:    Procedures    Medications  iohexol (OMNIPAQUE) 350 MG/ML injection 100 mL (100 mLs Intravenous Contrast Given 09/18/20 1158)  magnesium citrate solution 1 Bottle (1 Bottle Oral Given 09/18/20 1308)  sorbitol, milk of mag, mineral oil, glycerin (SMOG) enema (960 mLs Rectal Given 09/18/20 1440)  ondansetron (ZOFRAN) injection 4 mg (4 mg Intravenous Given 09/18/20 1339)  dicyclomine (BENTYL) tablet 20 mg (20 mg Oral Given 09/18/20 1610)  promethazine (PHENERGAN) injection 12.5 mg (12.5 mg Intravenous Given 09/18/20 1610)  sodium chloride 0.9 % bolus 1,000 mL (1,000 mLs Intravenous New Bag/Given 09/18/20 1641)  haloperidol lactate (HALDOL) injection 2.5 mg (2.5 mg Intravenous Given 09/18/20 1748)   alum & mag hydroxide-simeth (MAALOX/MYLANTA) 200-200-20 MG/5ML suspension 30 mL (30 mLs Oral Given 09/18/20 1748)    And  lidocaine (XYLOCAINE) 2 % viscous mouth solution 15 mL (15 mLs Oral Given 09/18/20 1748)     ____________________________________________   INITIAL IMPRESSION / ASSESSMENT AND PLAN / ED COURSE  Pertinent labs & imaging results that were available during my care of the patient were reviewed by me and considered in my medical decision making (see chart for details).  Review of the [place] CSRS was performed in accordance of the [facility] prior to dispensing any controlled drugs.   Differential diagnosis includes, but is not limited to, biliary disease (biliary colic, acute cholecystitis, cholangitis, choledocholithiasis, etc), intrathoracic causes for epigastric abdominal pain including ACS, gastritis, duodenitis, pancreatitis, small bowel or large bowel obstruction, abdominal aortic aneurysm, hernia, and ulcer(s).   Patient presented the emergency department for evaluation of concerns of upper abdominal pain and constipation for 1.5 weeks.  Vital signs and exam are reassuring.  Lab work is largely unremarkable.  Abdominal x-ray shows a abundant stool burden without obstruction.  CT scan shows a moderate stool burden without obstruction and no additional acute abdominal abnormalities.  Patient was given a dose of Zofran and mag citrate in the emergency department and vomited shortly after.  She was then given a dose of Phenergan with Bentyl.  Nausea improved following the Phenergan and Bentyl.  Enema was started and patient vomited during the enema.  Patient states that the enema makes her stomach cramp, which is causing her to vomit.  Patient had a moderate stool following the enema.  Patient states that she still feels constipated and would like to try the enema once more.  During the second attempt at the enema, patient vomited again.  She again states that the enema caused her abdomen  to cramp and vomit.  She denies any vomiting before and only during enema attempt.  Patient had another moderate stool.  She does feel improved but still feels some abdominal cramping and still feels constipated.  She states that it feels like the stool is moving in her stomach.  Dr. Erma HeritageIsaacs was consulted and recommends a trial of GI cocktail and Haldol.  Following the medications, patient states that the abdominal cramping has resolved and she is feeling much improved.  She is requesting discharge.  Patient will be discharged home with prescriptions for Zofran, Bentyl, Protonix. Patient is to follow up with primary care as directed. Patient is given ED precautions to return to the ED for any worsening or new symptoms.   Haley Wong was evaluated in Emergency Department on 09/18/2020 for the symptoms described in the history of present illness. She was evaluated in the context of the global COVID-19 pandemic, which necessitated consideration that the patient might be at risk for infection with the SARS-CoV-2 virus that causes COVID-19. Institutional protocols and algorithms that pertain to the evaluation of patients at risk for COVID-19 are in a state of rapid change based on information released by regulatory bodies including the CDC and federal and state organizations. These policies and algorithms were followed during the patient's care in the ED.  ____________________________________________  FINAL CLINICAL IMPRESSION(S) / ED DIAGNOSES  Final diagnoses:  Pain of upper abdomen      NEW MEDICATIONS STARTED DURING THIS VISIT:  ED Discharge Orders         Ordered    dicyclomine (BENTYL) 10 MG capsule  4 times daily        09/18/20 1821    ondansetron (ZOFRAN) 4 MG tablet  Daily PRN        09/18/20 1821    pantoprazole (PROTONIX) 20 MG tablet  Daily        09/18/20 1821              This chart was dictated using voice recognition software/Dragon. Despite best efforts to  proofread, errors can occur which can change the meaning. Any change was purely unintentional.    Enid DerryWagner, Reagen Goates, PA-C 09/19/20 1057    Shaune PollackIsaacs, [place], MD 09/19/20 1455

## 2020-09-18 NOTE — ED Triage Notes (Addendum)
Pt to ED via POV stating that she was sent over from her PCP office because she has fecal impaction. Pt states that she had x-ray on Tuesday that showed fecal impaction on the right side. Pt has tried OTC medication without relief. Pt appears to be significantly uncomfortable but otherwise is in NAD.   Pt reports that she has had 30 pound weight gain since October.

## 2020-09-20 LAB — URINE CULTURE

## 2021-04-12 ENCOUNTER — Other Ambulatory Visit: Payer: Self-pay

## 2021-04-12 ENCOUNTER — Ambulatory Visit
Admission: EM | Admit: 2021-04-12 | Discharge: 2021-04-12 | Disposition: A | Discharge status: Home / Self Care | Payer: BC Managed Care – PPO | Attending: Family Medicine | Admitting: Family Medicine

## 2021-04-12 DIAGNOSIS — B9789 Other viral agents as the cause of diseases classified elsewhere: Secondary | ICD-10-CM | POA: Diagnosis present

## 2021-04-12 DIAGNOSIS — Z20822 Contact with and (suspected) exposure to covid-19: Secondary | ICD-10-CM | POA: Diagnosis not present

## 2021-04-12 DIAGNOSIS — J988 Other specified respiratory disorders: Secondary | ICD-10-CM | POA: Diagnosis present

## 2021-04-12 LAB — GROUP A STREP BY PCR: Group A Strep by PCR: NOT DETECTED

## 2021-04-12 MED ORDER — PREDNISONE 50 MG PO TABS: ORAL_TABLET | ORAL | 0 refills | Status: AC

## 2021-04-12 MED ORDER — PROMETHAZINE-DM 6.25-15 MG/5ML PO SYRP: 5.0000 mL | ORAL_SOLUTION | Freq: Four times a day (QID) | ORAL | 0 refills | Status: AC | PRN

## 2021-04-12 NOTE — ED Triage Notes (Signed)
Patient presents to Urgent Care with complaints of sore throat, dry cough, fatigue, and headache since Weds. Treating symptoms with tylenol, delsium, and sudafed. Req a covid test.   Denies fever.

## 2021-04-12 NOTE — ED Provider Notes (Signed)
[facility]    CSN: [id number] Arrival date & time: 04/12/21  3762      History   Chief Complaint Chief Complaint  Patient presents with   Sore Throat   Headache    HPI  35 year old female presents with the above complaints.  Patient reports her symptoms started on Wednesday she reports sore throat, dry cough, fatigue, headache.  She has tried over-the-counter Tylenol, Delsym, Sudafed.  No reported sick contacts.  No fever.  Cough has been keeping her up at night.  No known exacerbating factors.  No other reported symptoms.  No other complaints.  Patient requesting COVID testing today.   Past Medical History:  Diagnosis Date   Anemia    Anxiety    Asthma    excercised induced   Chronic interstitial cystitis    Dysrhythmia    tachycardia on B blockers . No longer takes has a resting   HR 80-90's    Family history of adverse reaction to anesthesia    Dad was combative before   GERD (gastroesophageal reflux disease)    Headache(784.0)    Heart murmur    Hx of gastric ulcer    Hx of varicella    IBS (irritable bowel syndrome)    Other hemoglobinopathies (HCC)    Tachycardia    take metoprolol 25mg  daily since 35 yo   Unspecified hemorrhoids without mention of complication     Patient Active Problem List   Diagnosis Date Noted   Dysmenorrhea 02/27/2020    Past Surgical History:  Procedure Laterality Date   COLONOSCOPY     LAPAROSCOPY  2009   exploratory surgery, diagnosed interstitial cystitis   ROBOTIC ASSISTED LAPAROSCOPIC HYSTERECTOMY AND SALPINGECTOMY Bilateral 02/27/2020   Procedure: XI ROBOTIC ASSISTED LAPAROSCOPIC HYSTERECTOMY AND SALPINGECTOMY;  Surgeon: 02/29/2020, MD;  Location: [facility] [place];  Service: Gynecology;  Laterality: Bilateral;  Tracie RNFA confirmed on 02/10/20 CS    OB History     Gravida  2   Para  2   Term  2   Preterm      AB      Living  2      SAB      IAB      Ectopic      Multiple       Live Births  2            Home Medications    Prior to Admission medications   Medication Sig Start Date End Date Taking? Authorizing Provider  azelastine (ASTELIN) 0.1 % nasal spray Place into the nose. 02/08/21 02/08/22 Yes [provider]  predniSONE (DELTASONE) 50 MG tablet 1 tablet daily x 5 days 04/12/21  Yes Ira Busbin G, DO  promethazine-dextromethorphan (PROMETHAZINE-DM) 6.25-15 MG/5ML syrup Take 5 mLs by mouth 4 (four) times daily as needed for cough. 04/12/21  Yes Tahjanae Blankenburg G, DO  dicyclomine (BENTYL) 10 MG capsule Take 1 capsule (10 mg total) by mouth 4 (four) times daily for 14 days. 09/18/20 10/02/20  10/04/20, PA-C  metoprolol succinate (TOPROL-XL) 25 MG 24 hr tablet Take 25 mg by mouth daily. 03/17/21   [provider]  omeprazole (PRILOSEC) 40 MG capsule Take 40 mg by mouth daily. 03/16/21   [provider]  pantoprazole (PROTONIX) 20 MG tablet Take 1 tablet (20 mg total) by mouth daily. 09/18/20 09/18/21  14/3/22, PA-C  PARoxetine (PAXIL) 20 MG tablet Take 20 mg by mouth daily. 04/07/21   [provider]  rizatriptan (MAXALT) 10 MG tablet Take by mouth.    [provider]  SUMAtriptan (IMITREX) 100 MG tablet Take by mouth.    [provider]    Family History Family History  Problem Relation Age of Onset   Hypothyroidism Maternal Aunt    Diabetes Maternal Grandmother    Kidney disease Maternal Grandmother    Hypothyroidism Maternal Grandmother    Diabetes Maternal Grandfather    Hypertension Maternal Grandfather    Heart attack Maternal Grandfather    Hypertension Paternal Grandfather    Hypothyroidism Maternal Aunt    Hypothyroidism Maternal Aunt    Hypothyroidism Maternal Aunt     Social History Social History   Tobacco Use   Smoking status: Never   Smokeless tobacco: Never  Vaping Use   Vaping Use: Never used  Substance Use Topics   Alcohol use: Yes    Comment: social     Drug  use: No     Allergies   Amoxicillin, Hydrocodone bit-homatrop mbr, Latex, Other, and Pineapple   Review of Systems Review of Systems Per HPI  Physical Exam Triage Vital Signs ED Triage Vitals  Enc Vitals Group     BP 04/12/21 1003 136/82     Pulse Rate 04/12/21 1003 77     Resp 04/12/21 1003 16     Temp 04/12/21 1003 98.5 F (36.9 C)     Temp Source 04/12/21 1003 Oral     SpO2 04/12/21 1003 99 %     Weight 04/12/21 1000 250 lb (113.4 kg)     Height --      Head Circumference --      Peak Flow --      Pain Score 04/12/21 1003 6     Pain Loc --      Pain Edu? --      Excl. in GC? --    Updated Vital Signs BP 136/82 (BP Location: Right Arm)   Pulse 77   Temp 98.5 F (36.9 C) (Oral)   Resp 16   Wt 113.4 kg   LMP 02/19/2020   SpO2 99%   BMI 44.29 kg/m   Visual Acuity Right Eye Distance:   Left Eye Distance:   Bilateral Distance:    Right Eye Near:   Left Eye Near:    Bilateral Near:     Physical Exam Vitals and nursing note reviewed.  Constitutional:      General: She is not in acute distress.    Appearance: Normal appearance. She is obese. She is not ill-appearing.  HENT:     Head: Normocephalic and atraumatic.     Mouth/Throat:     Pharynx: Oropharynx is clear. No oropharyngeal exudate.  Eyes:     General:        Right eye: No discharge.        Left eye: No discharge.     Conjunctiva/sclera: Conjunctivae normal.  Cardiovascular:     Rate and Rhythm: Normal rate and regular rhythm.     Heart sounds: No murmur heard. Pulmonary:     Effort: Pulmonary effort is normal.     Breath sounds: Normal breath sounds. No wheezing, rhonchi or rales.  Neurological:     Mental Status: She is alert.  Psychiatric:        Mood and Affect: Mood normal.        Behavior: Behavior normal.     UC Treatments / Results  Labs (all labs ordered are listed, but only  abnormal results are displayed) Labs Reviewed  SARS CORONAVIRUS 2 (TAT 6-24 HRS)  GROUP A STREP BY  PCR    EKG   Radiology No results found.  Procedures Procedures (including critical care time)  Medications Ordered in UC Medications - No data to display  Initial Impression / Assessment and Plan / UC Course  I have reviewed the triage vital signs and the nursing notes.  Pertinent labs & imaging results that were available during my care of the patient were reviewed by me and considered in my medical decision making (see chart for details).    35 year old female presents with a viral respiratory infection.  Exam benign.  Lungs clear.  Awaiting COVID test results.  Treating with prednisone and Promethazine DM.  Work note given.  Final Clinical Impressions(s) / UC Diagnoses   Final diagnoses:  Viral respiratory infection     Discharge Instructions      Rest. Lots of fluids.  Medication as prescribed. Should help your symptoms.  If persists, see your PCP.  COVID test will be back in the AM.  Take care  Dr. Adriana Simas    ED Prescriptions     Medication Sig Dispense Auth. Provider   predniSONE (DELTASONE) 50 MG tablet 1 tablet daily x 5 days 5 tablet Esti Demello G, DO   promethazine-dextromethorphan (PROMETHAZINE-DM) 6.25-15 MG/5ML syrup Take 5 mLs by mouth 4 (four) times daily as needed for cough. 118 mL Tommie Sams, DO      PDMP not reviewed this encounter.   Tommie Sams, DO 04/12/21 1045

## 2021-04-12 NOTE — Discharge Instructions (Addendum)
Rest. Lots of fluids.  Medication as prescribed. Should help your symptoms.  If persists, see your PCP.  COVID test will be back in the AM.  Take care  Dr. Adriana Simas

## 2021-04-13 LAB — SARS CORONAVIRUS 2 (TAT 6-24 HRS): SARS Coronavirus 2: NEGATIVE

## 2021-06-24 ENCOUNTER — Other Ambulatory Visit: Payer: Self-pay | Admitting: General Surgery

## 2021-06-24 DIAGNOSIS — R1011 Right upper quadrant pain: Secondary | ICD-10-CM

## 2021-07-06 ENCOUNTER — Ambulatory Visit
Admission: RE | Admit: 2021-07-06 | Discharge: 2021-07-06 | Disposition: A | Discharge status: Home / Self Care | Payer: BC Managed Care – PPO | Source: Ambulatory Visit | Attending: General Surgery | Admitting: General Surgery

## 2021-07-06 ENCOUNTER — Other Ambulatory Visit: Payer: Self-pay

## 2021-07-06 DIAGNOSIS — R1011 Right upper quadrant pain: Secondary | ICD-10-CM | POA: Diagnosis not present

## 2021-07-15 ENCOUNTER — Other Ambulatory Visit: Payer: Self-pay

## 2021-07-15 ENCOUNTER — Ambulatory Visit
Admission: RE | Admit: 2021-07-15 | Discharge: 2021-07-15 | Disposition: A | Discharge status: Home / Self Care | Payer: BC Managed Care – PPO | Source: Ambulatory Visit | Attending: General Surgery | Admitting: General Surgery

## 2021-07-15 DIAGNOSIS — R1011 Right upper quadrant pain: Secondary | ICD-10-CM | POA: Insufficient documentation

## 2021-07-15 MED ORDER — TECHNETIUM TC 99M MEBROFENIN IV KIT
5.0000 | PACK | Freq: Once | INTRAVENOUS | Status: AC | PRN
  Administered 2021-07-15: 5.1 via INTRAVENOUS

## 2022-01-25 IMAGING — NM NM HEPATO W/GB/PHARM/[PERSON_NAME]
2 series · 12 of 12 positions shown · non-contrast
Comparison: Ultrasound 10/06/2019

CLINICAL DATA: Right upper quadrant pain

EXAM:
NUCLEAR MEDICINE HEPATOBILIARY IMAGING WITH GALLBLADDER EF
TECHNIQUE: Sequential images of the abdomen were obtained [DATE] minutes
following intravenous administration of radiopharmaceutical. After
oral ingestion of Ensure, gallbladder ejection fraction was
determined. At 60 min, normal ejection fraction is greater than 33%.
RADIOPHARMACEUTICALS:  5.1 mCi Yc-00m  Choletec IV

[Series 1000: gallbladder ef · 4.80mm/px · 6 of 120 frames shown]
[frame 11/120]
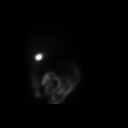
[frame 31/120]
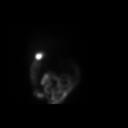
[frame 51/120]
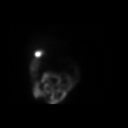
[frame 71/120]
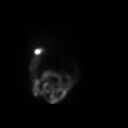
[frame 91/120]
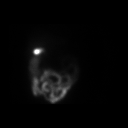
[frame 111/120]
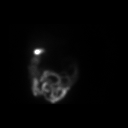

[Series 1000: hepatobiliary scan · 9.59mm/px · 6 of 60 frames shown]
[frame 6/60]
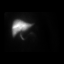
[frame 16/60]
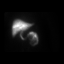
[frame 26/60]
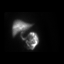
[frame 36/60]
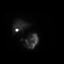
[frame 46/60]
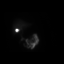
[frame 56/60]
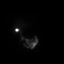

[12 of 12 positions shown; findings below may reference images not displayed]

FINDINGS: Prompt uptake and biliary excretion of activity by the liver is
seen. Gallbladder activity is visualized, consistent with patency of
cystic duct. Biliary activity passes into small bowel, consistent
with patent common bile duct.

Calculated gallbladder ejection fraction is 37%. (Normal gallbladder
ejection fraction with Ensure is greater than 33%.)
IMPRESSION: Examination is within normal limits.

## 2023-02-22 ENCOUNTER — Encounter: Payer: Self-pay | Admitting: Adult Health

## 2023-02-22 ENCOUNTER — Other Ambulatory Visit: Payer: Self-pay | Admitting: Adult Health

## 2023-02-22 DIAGNOSIS — R1013 Epigastric pain: Secondary | ICD-10-CM

## 2023-02-23 ENCOUNTER — Ambulatory Visit
Admission: RE | Admit: 2023-02-23 | Discharge: 2023-02-23 | Disposition: A | Discharge status: Home / Self Care | Payer: BC Managed Care – PPO | Source: Ambulatory Visit | Attending: Adult Health | Admitting: Adult Health

## 2023-02-23 DIAGNOSIS — R1013 Epigastric pain: Secondary | ICD-10-CM | POA: Diagnosis present
